# Patient Record
Sex: Female | Born: 1970
Health system: Southern US, Community
[De-identification: ages and names within clinical notes are randomized; demographics above are authoritative.]

## PROBLEM LIST (undated history)

## (undated) DIAGNOSIS — R05 Cough: Secondary | ICD-10-CM

## (undated) DIAGNOSIS — I499 Cardiac arrhythmia, unspecified: Secondary | ICD-10-CM

## (undated) DIAGNOSIS — T7840XA Allergy, unspecified, initial encounter: Secondary | ICD-10-CM

## (undated) DIAGNOSIS — C569 Malignant neoplasm of unspecified ovary: Secondary | ICD-10-CM

## (undated) DIAGNOSIS — E039 Hypothyroidism, unspecified: Secondary | ICD-10-CM

## (undated) DIAGNOSIS — A6920 Lyme disease, unspecified: Secondary | ICD-10-CM

## (undated) DIAGNOSIS — F419 Anxiety disorder, unspecified: Secondary | ICD-10-CM

## (undated) DIAGNOSIS — K219 Gastro-esophageal reflux disease without esophagitis: Secondary | ICD-10-CM

## (undated) DIAGNOSIS — R059 Cough, unspecified: Secondary | ICD-10-CM

## (undated) DIAGNOSIS — E785 Hyperlipidemia, unspecified: Secondary | ICD-10-CM

## (undated) DIAGNOSIS — S39012A Strain of muscle, fascia and tendon of lower back, initial encounter: Secondary | ICD-10-CM

## (undated) DIAGNOSIS — T753XXA Motion sickness, initial encounter: Secondary | ICD-10-CM

## (undated) DIAGNOSIS — J302 Other seasonal allergic rhinitis: Secondary | ICD-10-CM

## (undated) HISTORY — DX: Lyme disease, unspecified: A69.20

## (undated) HISTORY — DX: Malignant neoplasm of unspecified ovary: C56.9

## (undated) HISTORY — PX: LEFT OOPHORECTOMY: SHX1961

## (undated) HISTORY — DX: Allergy, unspecified, initial encounter: T78.40XA

## (undated) HISTORY — DX: Hyperlipidemia, unspecified: E78.5

---
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
## 2003-06-03 ENCOUNTER — Other Ambulatory Visit: Admission: RE | Admit: 2003-06-03 | Discharge: 2003-06-03 | Payer: Self-pay | Admitting: Obstetrics and Gynecology

## 2004-06-03 ENCOUNTER — Ambulatory Visit (HOSPITAL_COMMUNITY): Admission: RE | Admit: 2004-06-03 | Discharge: 2004-06-03 | Payer: Self-pay | Admitting: Obstetrics and Gynecology

## 2004-06-03 ENCOUNTER — Ambulatory Visit (HOSPITAL_BASED_OUTPATIENT_CLINIC_OR_DEPARTMENT_OTHER): Admission: RE | Admit: 2004-06-03 | Discharge: 2004-06-03 | Payer: Self-pay | Admitting: Obstetrics and Gynecology

## 2005-07-08 ENCOUNTER — Inpatient Hospital Stay (HOSPITAL_COMMUNITY): Admission: AD | Admit: 2005-07-08 | Discharge: 2005-07-08 | Payer: Self-pay | Admitting: Obstetrics & Gynecology

## 2005-08-06 ENCOUNTER — Inpatient Hospital Stay (HOSPITAL_COMMUNITY): Admission: AD | Admit: 2005-08-06 | Discharge: 2005-08-08 | Payer: Self-pay | Admitting: Obstetrics and Gynecology

## 2011-08-05 ENCOUNTER — Ambulatory Visit: Payer: Self-pay | Admitting: Unknown Physician Specialty

## 2011-09-09 ENCOUNTER — Ambulatory Visit: Payer: Self-pay | Admitting: Unknown Physician Specialty

## 2013-05-11 ENCOUNTER — Ambulatory Visit: Payer: Self-pay

## 2013-08-14 ENCOUNTER — Emergency Department: Payer: Self-pay | Admitting: Emergency Medicine

## 2013-08-14 LAB — COMPREHENSIVE METABOLIC PANEL
Albumin: 3.9 g/dL (ref 3.4–5.0)
Alkaline Phosphatase: 62 U/L
Anion Gap: 1 — ABNORMAL LOW (ref 7–16)
BUN: 10 mg/dL (ref 7–18)
Bilirubin,Total: 0.4 mg/dL (ref 0.2–1.0)
Calcium, Total: 8.9 mg/dL (ref 8.5–10.1)
Chloride: 105 mmol/L (ref 98–107)
Co2: 30 mmol/L (ref 21–32)
Creatinine: 0.7 mg/dL (ref 0.60–1.30)
EGFR (African American): >60
EGFR (Non-African Amer.): >60
Glucose: 95 mg/dL (ref 65–99)
Osmolality: 271 (ref 275–301)
Potassium: 4.3 mmol/L (ref 3.5–5.1)
SGOT(AST): 26 U/L (ref 15–37)
SGPT (ALT): 22 U/L (ref 12–78)
Sodium: 136 mmol/L (ref 136–145)
Total Protein: 7.4 g/dL (ref 6.4–8.2)

## 2013-08-14 LAB — CBC
HCT: 39.9 % (ref 35.0–47.0)
HGB: 13.7 g/dL (ref 12.0–16.0)
MCH: 32.1 pg (ref 26.0–34.0)
MCHC: 34.4 g/dL (ref 32.0–36.0)
MCV: 93 fL (ref 80–100)
Platelet: 269 10*3/uL (ref 150–440)
RBC: 4.28 10*6/uL (ref 3.80–5.20)
RDW: 12.3 % (ref 11.5–14.5)
WBC: 6.5 10*3/uL (ref 3.6–11.0)

## 2013-08-14 LAB — T4, FREE: Free Thyroxine: 1.37 ng/dL (ref 0.76–1.46)

## 2013-08-14 LAB — TROPONIN I: Troponin-I: <0.02 ng/mL

## 2015-01-27 ENCOUNTER — Encounter: Payer: Self-pay | Admitting: *Deleted

## 2015-01-29 NOTE — Discharge Instructions (Signed)

## 2015-01-31 ENCOUNTER — Encounter: Payer: Self-pay | Admitting: Unknown Physician Specialty

## 2015-01-31 ENCOUNTER — Ambulatory Visit: Payer: BLUE CROSS/BLUE SHIELD | Admitting: Student in an Organized Health Care Education/Training Program

## 2015-01-31 ENCOUNTER — Ambulatory Visit
Admission: RE | Admit: 2015-01-31 | Discharge: 2015-01-31 | Disposition: A | Discharge status: Home / Self Care | Payer: BLUE CROSS/BLUE SHIELD | Source: Ambulatory Visit | Attending: Unknown Physician Specialty | Admitting: Unknown Physician Specialty

## 2015-01-31 ENCOUNTER — Encounter
Admission: RE | Disposition: A | Discharge status: Home / Self Care | Payer: Self-pay | Source: Ambulatory Visit | Attending: Unknown Physician Specialty

## 2015-01-31 DIAGNOSIS — Z8543 Personal history of malignant neoplasm of ovary: Secondary | ICD-10-CM | POA: Diagnosis not present

## 2015-01-31 DIAGNOSIS — R229 Localized swelling, mass and lump, unspecified: Secondary | ICD-10-CM | POA: Diagnosis present

## 2015-01-31 DIAGNOSIS — Z79899 Other long term (current) drug therapy: Secondary | ICD-10-CM | POA: Insufficient documentation

## 2015-01-31 DIAGNOSIS — L720 Epidermal cyst: Secondary | ICD-10-CM | POA: Diagnosis not present

## 2015-01-31 DIAGNOSIS — F17200 Nicotine dependence, unspecified, uncomplicated: Secondary | ICD-10-CM | POA: Diagnosis not present

## 2015-01-31 DIAGNOSIS — E079 Disorder of thyroid, unspecified: Secondary | ICD-10-CM | POA: Insufficient documentation

## 2015-01-31 DIAGNOSIS — Z90722 Acquired absence of ovaries, bilateral: Secondary | ICD-10-CM | POA: Diagnosis not present

## 2015-01-31 HISTORY — DX: Cardiac arrhythmia, unspecified: I49.9

## 2015-01-31 HISTORY — PX: MASS BIOPSY: SHX5445

## 2015-01-31 HISTORY — DX: Cough, unspecified: R05.9

## 2015-01-31 HISTORY — DX: Anxiety disorder, unspecified: F41.9

## 2015-01-31 HISTORY — DX: Other seasonal allergic rhinitis: J30.2

## 2015-01-31 HISTORY — DX: Hypothyroidism, unspecified: E03.9

## 2015-01-31 HISTORY — DX: Motion sickness, initial encounter: T75.3XXA

## 2015-01-31 HISTORY — DX: Strain of muscle, fascia and tendon of lower back, initial encounter: S39.012A

## 2015-01-31 HISTORY — DX: Cough: R05

## 2015-01-31 HISTORY — DX: Gastro-esophageal reflux disease without esophagitis: K21.9

## 2015-01-31 SURGERY — BIOPSY, MASS, NECK
Anesthesia: General | Wound class: Clean

## 2015-01-31 MED ORDER — LACTATED RINGERS IV SOLN
INTRAVENOUS | Status: DC
  Administered 2015-01-31 (×2): via INTRAVENOUS

## 2015-01-31 MED ORDER — CEPHALEXIN 500 MG PO CAPS: 500.0000 mg | ORAL_CAPSULE | Freq: Two times a day (BID) | ORAL | Status: DC

## 2015-01-31 MED ORDER — PROPOFOL INFUSION 10 MG/ML OPTIME
INTRAVENOUS | Status: DC | PRN
  Administered 2015-01-31: 75 ug/kg/min via INTRAVENOUS

## 2015-01-31 MED ORDER — FENTANYL CITRATE (PF) 100 MCG/2ML IJ SOLN
INTRAMUSCULAR | Status: DC | PRN
  Administered 2015-01-31: 100 ug via INTRAVENOUS

## 2015-01-31 MED ORDER — LIDOCAINE-EPINEPHRINE 1 %-1:100000 IJ SOLN
INTRAMUSCULAR | Status: DC | PRN
  Administered 2015-01-31: 2 mL

## 2015-01-31 MED ORDER — LIDOCAINE HCL (CARDIAC) 20 MG/ML IV SOLN
INTRAVENOUS | Status: DC | PRN
  Administered 2015-01-31: 50 mg via INTRAVENOUS

## 2015-01-31 SURGICAL SUPPLY — 20 items
BLADE SURG 15 STRL LF DISP TIS (BLADE) ×1 IMPLANT
BLADE SURG 15 STRL SS (BLADE) ×2
CANISTER SUCT 1200ML W/VALVE (MISCELLANEOUS) ×2 IMPLANT
CORD BIP STRL DISP 12FT (MISCELLANEOUS) ×2 IMPLANT
ELECT CAUTERY BLADE TIP 2.5 (TIP) ×2
ELECTRODE CAUTERY BLDE TIP 2.5 (TIP) ×1 IMPLANT
GLOVE BIO SURGEON STRL SZ7.5 (GLOVE) ×4 IMPLANT
GOWN STRL REUS W/ TWL LRG LVL3 (GOWN DISPOSABLE) ×2 IMPLANT
GOWN STRL REUS W/TWL LRG LVL3 (GOWN DISPOSABLE) ×4
LIQUID BAND (GAUZE/BANDAGES/DRESSINGS) ×2 IMPLANT
NEEDLE HYPO 25GX1X1/2 BEV (NEEDLE) ×2 IMPLANT
NS IRRIG 500ML POUR BTL (IV SOLUTION) ×2 IMPLANT
PACK HEAD/NECK (MISCELLANEOUS) ×2 IMPLANT
PAD GROUND ADULT SPLIT (MISCELLANEOUS) ×2 IMPLANT
SPONGE KITTNER 5P (MISCELLANEOUS) ×2 IMPLANT
SUCTION FRAZIER TIP 10 FR DISP (SUCTIONS) ×2 IMPLANT
SUT SILK 2 0 (SUTURE)
SUT SILK 2-0 18XBRD TIE 12 (SUTURE) IMPLANT
SUT VIC AB 4-0 RB1 18 (SUTURE) ×2 IMPLANT
TOWEL OR 17X26 4PK STRL BLUE (TOWEL DISPOSABLE) ×2 IMPLANT

## 2015-01-31 NOTE — Op Note (Signed)
01/31/2015  12:13 PM    Jeanella Anton  [id number]   Pre-Op Dx: EPIDERMAL CYST  Post-op Dx: SAME  Proc: Excision Epidermal Inclusion cyst submental area of face 1x1cm   Surg:  Karista Aispuro T  Anes:  GOT  EBL:  <2cc  Comp:  none  Findings:  1x1 cm cystic mass  Procedure: Adahlia was taken to the OR and place in supine position.  IV sedation was administered.  The chin was prepped and draped sterilely.  A local anesthetic of lidocaine 1% with 1/100000 epi was used.  2cc injected.  A incision was made in a natural skin crease below the mass.  Dissection using short sharp scissors was performed identifying the mass.  It was excised in total from the surrounding tissues.  Hemostasis achieved using microbipolar.  The wound was irrigated using saline.  The incision was closed using 4-0 vicryl in a subcutaneous fashion.  The skin was sealed using dermabond.  The patient was taken to PACU in stable condition.  Dispo:   Home  Plan:  Discharge to home.    Emmert Roethler T  01/31/2015 12:13 PM

## 2015-01-31 NOTE — H&P (Signed)
  H+P  Reviewed and will be scanned in later. No changes noted. 

## 2015-01-31 NOTE — Transfer of Care (Signed)
Immediate Anesthesia Transfer of Care Note  Patient: Emily Knox  Procedure(s) Performed: Procedure(s): NECK MASS BIOPSY (N/A)  Patient Location: PACU  Anesthesia Type: General  Level of Consciousness: awake, alert  and patient cooperative  Airway and Oxygen Therapy: Patient Spontanous Breathing and Patient connected to supplemental oxygen  Post-op Assessment: Post-op Vital signs reviewed, Patient's Cardiovascular Status Stable, Respiratory Function Stable, Patent Airway and No signs of Nausea or vomiting  Post-op Vital Signs: Reviewed and stable  Complications: No apparent anesthesia complications

## 2015-01-31 NOTE — Anesthesia Preprocedure Evaluation (Signed)
Anesthesia Evaluation  Patient identified by MRN, date of birth, ID band Patient awake    Reviewed: Allergy & Precautions, NPO status , Patient's Chart, lab work & pertinent test results  Airway Mallampati: II  TM Distance: >3 FB Neck ROM: Full    Dental no notable dental hx.    Pulmonary neg pulmonary ROS, Current Smoker,  breath sounds clear to auscultation  Pulmonary exam normal       Cardiovascular negative cardio ROS Normal cardiovascular examRhythm:Regular Rate:Normal     Neuro/Psych negative neurological ROS  negative psych ROS   GI/Hepatic negative GI ROS, Neg liver ROS, GERD-  Medicated and Controlled,  Endo/Other  negative endocrine ROSHypothyroidism   Renal/GU negative Renal ROS  negative genitourinary   Musculoskeletal negative musculoskeletal ROS (+)   Abdominal   Peds negative pediatric ROS (+)  Hematology negative hematology ROS (+)   Anesthesia Other Findings   Reproductive/Obstetrics negative OB ROS                             Anesthesia Physical Anesthesia Plan  ASA: II  Anesthesia Plan: General   Post-op Pain Management:    Induction: Intravenous  Airway Management Planned:   Additional Equipment:   Intra-op Plan:   Post-operative Plan: Extubation in OR  Informed Consent: I have reviewed the patients History and Physical, chart, labs and discussed the procedure including the risks, benefits and alternatives for the proposed anesthesia with the patient or authorized representative who has indicated his/her understanding and acceptance.   Dental advisory given  Plan Discussed with: CRNA  Anesthesia Plan Comments:         Anesthesia Quick Evaluation

## 2015-01-31 NOTE — Anesthesia Postprocedure Evaluation (Signed)
  Anesthesia Post-op Note  Patient: Emily Knox  Procedure(s) Performed: Procedure(s): NECK MASS BIOPSY (N/A)  Anesthesia type:General  Patient location: PACU  Post pain: Pain level controlled  Post assessment: Post-op Vital signs reviewed, Patient's Cardiovascular Status Stable, Respiratory Function Stable, Patent Airway and No signs of Nausea or vomiting  Post vital signs: Reviewed and stable  Last Vitals:  Filed Vitals:   01/31/15 1223  BP:   Pulse: 94  Temp:   Resp: 22    Level of consciousness: awake, alert  and patient cooperative  Complications: No apparent anesthesia complications

## 2015-01-31 NOTE — Anesthesia Procedure Notes (Signed)
Procedure Name: MAC Performed by: Giorgia Wahler Pre-anesthesia Checklist: Patient identified, Emergency Drugs available, Suction available, Timeout performed and Patient being monitored Patient Re-evaluated:Patient Re-evaluated prior to inductionOxygen Delivery Method: Nasal cannula Placement Confirmation: positive ETCO2       

## 2015-02-04 LAB — SURGICAL PATHOLOGY

## 2016-05-20 DIAGNOSIS — R112 Nausea with vomiting, unspecified: Secondary | ICD-10-CM | POA: Diagnosis not present

## 2016-08-10 DIAGNOSIS — E039 Hypothyroidism, unspecified: Secondary | ICD-10-CM | POA: Diagnosis not present

## 2016-08-17 DIAGNOSIS — E039 Hypothyroidism, unspecified: Secondary | ICD-10-CM | POA: Diagnosis not present

## 2016-08-17 DIAGNOSIS — F419 Anxiety disorder, unspecified: Secondary | ICD-10-CM | POA: Diagnosis not present

## 2016-08-17 DIAGNOSIS — G5603 Carpal tunnel syndrome, bilateral upper limbs: Secondary | ICD-10-CM | POA: Diagnosis not present

## 2016-08-21 DIAGNOSIS — J019 Acute sinusitis, unspecified: Secondary | ICD-10-CM | POA: Diagnosis not present

## 2016-08-28 DIAGNOSIS — J309 Allergic rhinitis, unspecified: Secondary | ICD-10-CM | POA: Diagnosis not present

## 2016-08-28 DIAGNOSIS — R05 Cough: Secondary | ICD-10-CM | POA: Diagnosis not present

## 2016-08-28 DIAGNOSIS — J019 Acute sinusitis, unspecified: Secondary | ICD-10-CM | POA: Diagnosis not present

## 2016-09-22 DIAGNOSIS — J019 Acute sinusitis, unspecified: Secondary | ICD-10-CM | POA: Diagnosis not present

## 2017-02-09 DIAGNOSIS — J019 Acute sinusitis, unspecified: Secondary | ICD-10-CM | POA: Diagnosis not present

## 2017-02-09 DIAGNOSIS — R51 Headache: Secondary | ICD-10-CM | POA: Diagnosis not present

## 2017-02-09 DIAGNOSIS — B9689 Other specified bacterial agents as the cause of diseases classified elsewhere: Secondary | ICD-10-CM | POA: Diagnosis not present

## 2017-02-09 DIAGNOSIS — R5383 Other fatigue: Secondary | ICD-10-CM | POA: Diagnosis not present

## 2017-04-14 ENCOUNTER — Ambulatory Visit (INDEPENDENT_AMBULATORY_CARE_PROVIDER_SITE_OTHER): Payer: BLUE CROSS/BLUE SHIELD | Admitting: Family

## 2017-04-14 ENCOUNTER — Encounter: Payer: Self-pay | Admitting: Family

## 2017-04-14 VITALS — BP 110/70 | HR 87 | Temp 98.4°F | Resp 12 | Ht 61.0 in | Wt 118.0 lb

## 2017-04-14 DIAGNOSIS — Z8543 Personal history of malignant neoplasm of ovary: Secondary | ICD-10-CM | POA: Insufficient documentation

## 2017-04-14 DIAGNOSIS — E039 Hypothyroidism, unspecified: Secondary | ICD-10-CM | POA: Insufficient documentation

## 2017-04-14 DIAGNOSIS — Z0000 Encounter for general adult medical examination without abnormal findings: Secondary | ICD-10-CM | POA: Insufficient documentation

## 2017-04-14 DIAGNOSIS — Z7689 Persons encountering health services in other specified circumstances: Secondary | ICD-10-CM

## 2017-04-14 DIAGNOSIS — F411 Generalized anxiety disorder: Secondary | ICD-10-CM

## 2017-04-14 NOTE — Assessment & Plan Note (Signed)
Asymptomatic .Follows endocrine, Dr. Michiel Sites

## 2017-04-14 NOTE — Assessment & Plan Note (Signed)
Clinically asymptomatic today. Discussed importance of maintaining CPE and also considering CT abdomen, CA 125 at CPE. Patient and I will continue to discuss what is comfortable surveillance for her.

## 2017-04-14 NOTE — Assessment & Plan Note (Signed)
Reviewed prior medical history. Patient will return for CPE, fasting labs. She is also due for Pap smear and mammogram.

## 2017-04-14 NOTE — Progress Notes (Signed)
Subjective:    Patient ID: Emily Knox, female    DOB: [birth year], 46 y.o.   MRN: [id number]  CC: Emily Knox is a 46 y.o. female who presents today to establish care.    HPI:  No prior pcp.   Hypothyroidism- follows with dr Chalmers Cater and checks TSH routinely. No cold/heat intolerance, skin changes.  GAD- Started wellbutrin couple of years ago to quit smoking and how helps with   No depression. No thoughts of nurting or anyone else. Notes increased anxiety- mostly because work as 'boomed'. Has had h/o panic attacks, none recently.  Wellbutrin helps.   Smoker  H/o ovarian cancer- states dysgerminoma.  left oophorectomy when 46 years old  Followed with Dr Laverta [facility], no longer follows with OB . In her 20's , would run the ca-125. No pelvic, dyspareunia, abdominal distention.        HISTORY:  Past Medical History:  Diagnosis Date  . Allergy   . Anxiety    panic attacks  . Cough    due to allergies  . Dysrhythmia    palpatations - due to thyroid  . GERD (gastroesophageal reflux disease)   . Hyperlipidemia   . Hypothyroidism   . Low back strain   . Lyme disease    2010  . Motion sickness    all moving vehicles  . Ovarian cancer ([place]) 1991   left ovary  . Seasonal allergies    Past Surgical History:  Procedure Laterality Date  . LEFT OOPHORECTOMY    . MASS BIOPSY N/A 01/31/2015   Procedure: NECK MASS BIOPSY;  Surgeon: Beverly Gust, MD;  Location: [facility];  Service: ENT;  Laterality: N/A;   History reviewed. No pertinent family history.  Allergies: Onion; Peanuts [peanut oil]; and Tomato Current Outpatient Prescriptions on File Prior to Visit  Medication Sig Dispense Refill  . cephALEXin (KEFLEX) 500 MG capsule Take 1 capsule (500 mg total) by mouth 2 (two) times daily. (Patient not taking: Reported on 04/14/2017) 10 capsule 0  . levothyroxine (SYNTHROID, LEVOTHROID) 112 MCG tablet Take 112 mcg by mouth daily before breakfast.    .  Nutritional Supplements (JUICE PLUS FIBRE PO) Take by mouth daily.     No current facility-administered medications on file prior to visit.     Social History  Substance Use Topics  . Smoking status: Current Every Day Smoker    Packs/day: 1.00    Years: 20.00  . Smokeless tobacco: Never Used  . Alcohol use 2.4 oz/week    4 Cans of beer per week    Review of Systems  Constitutional: Negative for chills and fever.  Respiratory: Negative for cough.   Cardiovascular: Negative for chest pain and palpitations.  Gastrointestinal: Negative for abdominal distention, abdominal pain, nausea and vomiting.  Genitourinary: Negative for dyspareunia.      Objective:    BP 110/70 (BP Location: Left Arm, Patient Position: Sitting, Cuff Size: Normal)   Pulse 87   Temp 98.4 F (36.9 C) (Oral)   Resp 12   Ht 5\' 1"  (1.549 m)   Wt 118 lb (53.5 kg)   SpO2 99%   BMI 22.30 kg/m  BP Readings from Last 3 Encounters:  04/14/17 110/70  01/31/15 110/71   Wt Readings from Last 3 Encounters:  04/14/17 118 lb (53.5 kg)  01/31/15 118 lb (53.5 kg)    Physical Exam  Constitutional: She appears well-developed and well-nourished.  Eyes: Conjunctivae are normal.  Cardiovascular: Normal rate, regular rhythm,  normal heart sounds and normal pulses.   Pulmonary/Chest: Effort normal and breath sounds normal. She has no wheezes. She has no rhonchi. She has no rales.  Neurological: She is alert.  Skin: Skin is warm and dry.  Psychiatric: She has a normal mood and affect. Her speech is normal and behavior is normal. Thought content normal.  Vitals reviewed.      Assessment & Plan:   Problem List Items Addressed This Visit      Endocrine   Hypothyroidism    Asymptomatic .Follows endocrine, Dr. Michiel Sites      Relevant Medications   SYNTHROID 100 MCG tablet     Other   Encounter to establish care - Primary    Reviewed prior medical history. Patient will return for CPE, fasting labs. She is also due  for Pap smear and mammogram.      GAD (generalized anxiety disorder)    Stable. Controlled on Wellbutrin. Will continue current regimen at this time.      History of ovarian cancer    Clinically asymptomatic today. Discussed importance of maintaining CPE and also considering CT abdomen, CA 125 at CPE. Patient and I will continue to discuss what is comfortable surveillance for her.           I am having Ms. Mothershead maintain her levothyroxine, Nutritional Supplements (JUICE PLUS FIBRE PO), cephALEXin, buPROPion, and SYNTHROID.   Meds ordered this encounter  Medications  . buPROPion (WELLBUTRIN XL) 150 MG 24 hr tablet    Sig: take 1 tablet by mouth once daily  . SYNTHROID 100 MCG tablet    Refill:  3    Return precautions given.   Risks, benefits, and alternatives of the medications and treatment plan prescribed today were discussed, and patient expressed understanding.   Education regarding symptom management and diagnosis given to patient on AVS.  Continue to follow with Burnard Hawthorne, FNP for routine health maintenance.   Emily Knox and I agreed with plan.   Mable Paris, FNP

## 2017-04-14 NOTE — Assessment & Plan Note (Signed)
Stable. Controlled on Wellbutrin. Will continue current regimen at this time.

## 2017-04-14 NOTE — Patient Instructions (Signed)
Pleasure meeting you Please return fasting labs and physical exam including Pap smear.

## 2017-04-21 ENCOUNTER — Encounter: Payer: Self-pay | Admitting: Family

## 2017-04-21 ENCOUNTER — Other Ambulatory Visit (HOSPITAL_COMMUNITY)
Admission: RE | Admit: 2017-04-21 | Discharge: 2017-04-21 | Disposition: A | Discharge status: Home / Self Care | Payer: BLUE CROSS/BLUE SHIELD | Source: Ambulatory Visit | Attending: Family | Admitting: Family

## 2017-04-21 ENCOUNTER — Ambulatory Visit (INDEPENDENT_AMBULATORY_CARE_PROVIDER_SITE_OTHER): Payer: BLUE CROSS/BLUE SHIELD | Admitting: Family

## 2017-04-21 VITALS — HR 84 | Temp 98.2°F | Ht 61.0 in | Wt 118.2 lb

## 2017-04-21 DIAGNOSIS — Z0000 Encounter for general adult medical examination without abnormal findings: Secondary | ICD-10-CM | POA: Diagnosis not present

## 2017-04-21 DIAGNOSIS — Z8543 Personal history of malignant neoplasm of ovary: Secondary | ICD-10-CM

## 2017-04-21 LAB — CBC WITH DIFFERENTIAL/PLATELET
Basophils Absolute: 0.1 10*3/uL (ref 0.0–0.1)
Basophils Relative: 2.1 % (ref 0.0–3.0)
Eosinophils Absolute: 0.4 10*3/uL (ref 0.0–0.7)
Eosinophils Relative: 5.1 % — ABNORMAL HIGH (ref 0.0–5.0)
HCT: 43.5 % (ref 36.0–46.0)
Hemoglobin: 14.6 g/dL (ref 12.0–15.0)
Lymphocytes Relative: 32.5 % (ref 12.0–46.0)
Lymphs Abs: 2.2 10*3/uL (ref 0.7–4.0)
MCHC: 33.4 g/dL (ref 30.0–36.0)
MCV: 96.3 fl (ref 78.0–100.0)
Monocytes Absolute: 0.6 10*3/uL (ref 0.1–1.0)
Monocytes Relative: 8.1 % (ref 3.0–12.0)
Neutro Abs: 3.6 10*3/uL (ref 1.4–7.7)
Neutrophils Relative %: 52.2 % (ref 43.0–77.0)
Platelets: 317 10*3/uL (ref 150.0–400.0)
RBC: 4.52 Mil/uL (ref 3.87–5.11)
RDW: 12.7 % (ref 11.5–15.5)
WBC: 6.9 10*3/uL (ref 4.0–10.5)

## 2017-04-21 LAB — LIPID PANEL
Cholesterol: 179 mg/dL (ref 0–200)
HDL: 58.8 mg/dL (ref >39.00)
LDL Cholesterol: 102 mg/dL — ABNORMAL HIGH (ref 0–99)
NonHDL: 120.38
Total CHOL/HDL Ratio: 3
Triglycerides: 90 mg/dL (ref 0.0–149.0)
VLDL: 18 mg/dL (ref 0.0–40.0)

## 2017-04-21 LAB — COMPREHENSIVE METABOLIC PANEL
ALT: 14 U/L (ref 0–35)
AST: 22 U/L (ref 0–37)
Albumin: 4.7 g/dL (ref 3.5–5.2)
Alkaline Phosphatase: 47 U/L (ref 39–117)
BUN: 10 mg/dL (ref 6–23)
CO2: 29 mEq/L (ref 19–32)
Calcium: 9.7 mg/dL (ref 8.4–10.5)
Chloride: 103 mEq/L (ref 96–112)
Creatinine, Ser: 0.72 mg/dL (ref 0.40–1.20)
GFR: 92.75 mL/min (ref >60.00)
Glucose, Bld: 98 mg/dL (ref 70–99)
Potassium: 4.1 mEq/L (ref 3.5–5.1)
Sodium: 140 mEq/L (ref 135–145)
Total Bilirubin: 0.4 mg/dL (ref 0.2–1.2)
Total Protein: 7.8 g/dL (ref 6.0–8.3)

## 2017-04-21 LAB — VITAMIN D 25 HYDROXY (VIT D DEFICIENCY, FRACTURES): VITD: 42.79 ng/mL (ref 30.00–100.00)

## 2017-04-21 LAB — HEMOGLOBIN A1C: Hgb A1c MFr Bld: 5.8 % (ref 4.6–6.5)

## 2017-04-21 LAB — TSH: TSH: 2.59 u[IU]/mL (ref 0.35–4.50)

## 2017-04-21 NOTE — Patient Instructions (Addendum)
Let me know about colon cancer history and family history in general - may drop off a sheet or message on mychart.  Tdap vaccine at local pharmacy  Health Maintenance, Female Adopting a healthy lifestyle and getting preventive care can go a long way to promote health and wellness. Talk with your health care provider about what schedule of regular examinations is right for you. This is a good chance for you to check in with your provider about disease prevention and staying healthy. In between checkups, there are plenty of things you can do on your own. Experts have done a lot of research about which lifestyle changes and preventive measures are most likely to keep you healthy. Ask your health care provider for more information. Weight and diet Eat a healthy diet  Be sure to include plenty of vegetables, fruits, low-fat dairy products, and lean protein.  Do not eat a lot of foods high in solid fats, added sugars, or salt.  Get regular exercise. This is one of the most important things you can do for your health. ? Most adults should exercise for at least 150 minutes each week. The exercise should increase your heart rate and make you sweat (moderate-intensity exercise). ? Most adults should also do strengthening exercises at least twice a week. This is in addition to the moderate-intensity exercise.  Maintain a healthy weight  Body mass index (BMI) is a measurement that can be used to identify possible weight problems. It estimates body fat based on height and weight. Your health care provider can help determine your BMI and help you achieve or maintain a healthy weight.  For females 63 years of age and older: ? A BMI below 18.5 is considered underweight. ? A BMI of 18.5 to 24.9 is normal. ? A BMI of 25 to 29.9 is considered overweight. ? A BMI of 30 and above is considered obese.  Watch levels of cholesterol and blood lipids  You should start having your blood tested for lipids and  cholesterol at 46 years of age, then have this test every 5 years.  You may need to have your cholesterol levels checked more often if: ? Your lipid or cholesterol levels are high. ? You are older than 46 years of age. ? You are at high risk for heart disease.  Cancer screening Lung Cancer  Lung cancer screening is recommended for adults 65-77 years old who are at high risk for lung cancer because of a history of smoking.  A yearly low-dose CT scan of the lungs is recommended for people who: ? Currently smoke. ? Have quit within the past 15 years. ? Have at least a 30-pack-year history of smoking. A pack year is smoking an average of one pack of cigarettes a day for 1 year.  Yearly screening should continue until it has been 15 years since you quit.  Yearly screening should stop if you develop a health problem that would prevent you from having lung cancer treatment.  Breast Cancer  Practice breast self-awareness. This means understanding how your breasts normally appear and feel.  It also means doing regular breast self-exams. Let your health care provider know about any changes, no matter how small.  If you are in your 20s or 30s, you should have a clinical breast exam (CBE) by a health care provider every 1-3 years as part of a regular health exam.  If you are 58 or older, have a CBE every year. Also consider having a breast X-ray (  mammogram) every year.  If you have a family history of breast cancer, talk to your health care provider about genetic screening.  If you are at high risk for breast cancer, talk to your health care provider about having an MRI and a mammogram every year.  Breast cancer gene (BRCA) assessment is recommended for women who have family members with BRCA-related cancers. BRCA-related cancers include: ? Breast. ? Ovarian. ? Tubal. ? Peritoneal cancers.  Results of the assessment will determine the need for genetic counseling and BRCA1 and BRCA2  testing.  Cervical Cancer Your health care provider may recommend that you be screened regularly for cancer of the pelvic organs (ovaries, uterus, and vagina). This screening involves a pelvic examination, including checking for microscopic changes to the surface of your cervix (Pap test). You may be encouraged to have this screening done every 3 years, beginning at age 63.  For women ages 32-65, health care providers may recommend pelvic exams and Pap testing every 3 years, or they may recommend the Pap and pelvic exam, combined with testing for human papilloma virus (HPV), every 5 years. Some types of HPV increase your risk of cervical cancer. Testing for HPV may also be done on women of any age with unclear Pap test results.  Other health care providers may not recommend any screening for nonpregnant women who are considered low risk for pelvic cancer and who do not have symptoms. Ask your health care provider if a screening pelvic exam is right for you.  If you have had past treatment for cervical cancer or a condition that could lead to cancer, you need Pap tests and screening for cancer for at least 20 years after your treatment. If Pap tests have been discontinued, your risk factors (such as having a new sexual partner) need to be reassessed to determine if screening should resume. Some women have medical problems that increase the chance of getting cervical cancer. In these cases, your health care provider may recommend more frequent screening and Pap tests.  Colorectal Cancer  This type of cancer can be detected and often prevented.  Routine colorectal cancer screening usually begins at 46 years of age and continues through 46 years of age.  Your health care provider may recommend screening at an earlier age if you have risk factors for colon cancer.  Your health care provider may also recommend using home test kits to check for hidden blood in the stool.  A small camera at the end of a  tube can be used to examine your colon directly (sigmoidoscopy or colonoscopy). This is done to check for the earliest forms of colorectal cancer.  Routine screening usually begins at age 80.  Direct examination of the colon should be repeated every 5-10 years through 46 years of age. However, you may need to be screened more often if early forms of precancerous polyps or small growths are found.  Skin Cancer  Check your skin from head to toe regularly.  Tell your health care provider about any new moles or changes in moles, especially if there is a change in a mole's shape or color.  Also tell your health care provider if you have a mole that is larger than the size of a pencil eraser.  Always use sunscreen. Apply sunscreen liberally and repeatedly throughout the day.  Protect yourself by wearing long sleeves, pants, a wide-brimmed hat, and sunglasses whenever you are outside.  Heart disease, diabetes, and high blood pressure  High blood pressure  causes heart disease and increases the risk of stroke. High blood pressure is more likely to develop in: ? People who have blood pressure in the high end of the normal range (130-139/85-89 mm Hg). ? People who are overweight or obese. ? People who are African American.  If you are 84-42 years of age, have your blood pressure checked every 3-5 years. If you are 50 years of age or older, have your blood pressure checked every year. You should have your blood pressure measured twice-once when you are at a hospital or clinic, and once when you are not at a hospital or clinic. Record the average of the two measurements. To check your blood pressure when you are not at a hospital or clinic, you can use: ? An automated blood pressure machine at a pharmacy. ? A home blood pressure monitor.  If you are between 7 years and 63 years old, ask your health care provider if you should take aspirin to prevent strokes.  Have regular diabetes screenings. This  involves taking a blood sample to check your fasting blood sugar level. ? If you are at a normal weight and have a low risk for diabetes, have this test once every three years after 46 years of age. ? If you are overweight and have a high risk for diabetes, consider being tested at a younger age or more often. Preventing infection Hepatitis B  If you have a higher risk for hepatitis B, you should be screened for this virus. You are considered at high risk for hepatitis B if: ? You were born in a country where hepatitis B is common. Ask your health care provider which countries are considered high risk. ? Your parents were born in a high-risk country, and you have not been immunized against hepatitis B (hepatitis B vaccine). ? You have HIV or AIDS. ? You use needles to inject street drugs. ? You live with someone who has hepatitis B. ? You have had sex with someone who has hepatitis B. ? You get hemodialysis treatment. ? You take certain medicines for conditions, including cancer, organ transplantation, and autoimmune conditions.  Hepatitis C  Blood testing is recommended for: ? Everyone born from [birth year] through 1965. ? Anyone with known risk factors for hepatitis C.  Sexually transmitted infections (STIs)  You should be screened for sexually transmitted infections (STIs) including gonorrhea and chlamydia if: ? You are sexually active and are younger than 46 years of age. ? You are older than 46 years of age and your health care provider tells you that you are at risk for this type of infection. ? Your sexual activity has changed since you were last screened and you are at an increased risk for chlamydia or gonorrhea. Ask your health care provider if you are at risk.  If you do not have HIV, but are at risk, it may be recommended that you take a prescription medicine daily to prevent HIV infection. This is called pre-exposure prophylaxis (PrEP). You are considered at risk if: ? You are  sexually active and do not regularly use condoms or know the HIV status of your partner(s). ? You take drugs by injection. ? You are sexually active with a partner who has HIV.  Talk with your health care provider about whether you are at high risk of being infected with HIV. If you choose to begin PrEP, you should first be tested for HIV. You should then be tested every 3 months for as long  as you are taking PrEP. Pregnancy  If you are premenopausal and you may become pregnant, ask your health care provider about preconception counseling.  If you may become pregnant, take 400 to 800 micrograms (mcg) of folic acid every day.  If you want to prevent pregnancy, talk to your health care provider about birth control (contraception). Osteoporosis and menopause  Osteoporosis is a disease in which the bones lose minerals and strength with aging. This can result in serious bone fractures. Your risk for osteoporosis can be identified using a bone density scan.  If you are 32 years of age or older, or if you are at risk for osteoporosis and fractures, ask your health care provider if you should be screened.  Ask your health care provider whether you should take a calcium or vitamin D supplement to lower your risk for osteoporosis.  Menopause may have certain physical symptoms and risks.  Hormone replacement therapy may reduce some of these symptoms and risks. Talk to your health care provider about whether hormone replacement therapy is right for you. Follow these instructions at home:  Schedule regular health, dental, and eye exams.  Stay current with your immunizations.  Do not use any tobacco products including cigarettes, chewing tobacco, or electronic cigarettes.  If you are pregnant, do not drink alcohol.  If you are breastfeeding, limit how much and how often you drink alcohol.  Limit alcohol intake to no more than 1 drink per day for nonpregnant women. One drink equals 12 ounces of  beer, 5 ounces of wine, or 1 ounces of hard liquor.  Do not use street drugs.  Do not share needles.  Ask your health care provider for help if you need support or information about quitting drugs.  Tell your health care provider if you often feel depressed.  Tell your health care provider if you have ever been abused or do not feel safe at home. This information is not intended to replace advice given to you by your health care provider. Make sure you discuss any questions you have with your health care provider. Document Released: 03/22/2011 Document Revised: 02/12/2016 Document Reviewed: 06/10/2015 Elsevier Interactive Patient Education  [employer].

## 2017-04-21 NOTE — Assessment & Plan Note (Signed)
Re-discussed surveillance with CT abdomen and pelvis and CA 125. Politely declines surveillance at this time, will let me know.

## 2017-04-21 NOTE — Progress Notes (Signed)
Pre visit review using our clinic review tool, if applicable. No additional management support is needed unless otherwise documented below in the visit note. 

## 2017-04-21 NOTE — Assessment & Plan Note (Addendum)
Pap and CBE performed. Advised 3d mammogram and tdap at local pharmacy. Labs today. Encouraged smoking cessation. Declines trying another medication such as chantix. No family history data in chart- patient will bring in or message me once she is home.

## 2017-04-21 NOTE — Progress Notes (Signed)
Subjective:    Patient ID: Emily Knox, female    DOB: 06/22/1971, 46 y.o.   MRN: [id number]  CC: Emily Knox is a 46 y.o. female who presents today for physical exam.    HPI: Feeling well  No complaints     History of ovarian cancer-declines ca 125, imaging at this time. No dyspareunia, abdominal distention.  Colorectal Cancer Screening: Unsure of family history. No abdominal pain or changes in BM. No blood in stool.  Breast Cancer Screening: Mammogram due Cervical Cancer Screening: due Bone Health screening/DEXA for 65+: No increased fracture risk. Defer screening at this time. Lung Cancer Screening: Doesn't have 30 year pack year history and age > 48 years.  Immunizations       Tetanus - due Declines pneumovax vaccine HIV Screening- Candidate for , consents Labs: Screening labs today. Exercise: Gets regular exercise.  Alcohol use: once per week Smoking/tobacco use: smoker.  Regular dental exams: utd Wears seat belt: Yes. Skin: no new lesions; no h/o skin cancer  HISTORY:  Past Medical History:  Diagnosis Date  . Allergy   . Anxiety    panic attacks  . Cough    due to allergies  . Dysrhythmia    palpatations - due to thyroid  . GERD (gastroesophageal reflux disease)   . Hyperlipidemia   . Hypothyroidism   . Low back strain   . Lyme disease    2010  . Motion sickness    all moving vehicles  . Ovarian cancer ([place]) 1991   left ovary  . Seasonal allergies     Past Surgical History:  Procedure Laterality Date  . LEFT OOPHORECTOMY    . MASS BIOPSY N/A 01/31/2015   Procedure: NECK MASS BIOPSY;  Surgeon: Beverly Gust, MD;  Location: [facility];  Service: ENT;  Laterality: N/A;   No family history on file.    ALLERGIES: Onion; Peanuts [peanut oil]; and Tomato  Current Outpatient Prescriptions on File Prior to Visit  Medication Sig Dispense Refill  . buPROPion (WELLBUTRIN XL) 150 MG 24 hr tablet take 1 tablet by mouth once daily     . cephALEXin (KEFLEX) 500 MG capsule Take 1 capsule (500 mg total) by mouth 2 (two) times daily. 10 capsule 0  . levothyroxine (SYNTHROID, LEVOTHROID) 112 MCG tablet Take 112 mcg by mouth daily before breakfast.    . Nutritional Supplements (JUICE PLUS FIBRE PO) Take by mouth daily.    Marland Kitchen SYNTHROID 100 MCG tablet   3   No current facility-administered medications on file prior to visit.     Social History  Substance Use Topics  . Smoking status: Current Every Day Smoker    Packs/day: 1.00    Years: 20.00  . Smokeless tobacco: Never Used  . Alcohol use 2.4 oz/week    4 Cans of beer per week    Review of Systems  Constitutional: Negative for chills, fever and unexpected weight change.  HENT: Negative for congestion.   Respiratory: Negative for cough.   Cardiovascular: Negative for chest pain, palpitations and leg swelling.  Gastrointestinal: Negative for abdominal distention, nausea and vomiting.  Genitourinary: Negative for dyspareunia and pelvic pain.  Musculoskeletal: Negative for arthralgias and myalgias.  Skin: Negative for rash.  Neurological: Negative for headaches.  Hematological: Negative for adenopathy.  Psychiatric/Behavioral: Negative for confusion.      Objective:    Pulse 84   Temp 98.2 F (36.8 C) (Oral)   Ht 5\' 1"  (1.549 m)  Wt 118 lb 3.2 oz (53.6 kg)   SpO2 98%   BMI 22.33 kg/m   BP Readings from Last 3 Encounters:  04/14/17 110/70  01/31/15 110/71   Wt Readings from Last 3 Encounters:  04/21/17 118 lb 3.2 oz (53.6 kg)  04/14/17 118 lb (53.5 kg)  01/31/15 118 lb (53.5 kg)    Physical Exam  Constitutional: She appears well-developed and well-nourished.  Eyes: Conjunctivae are normal.  Neck: No thyroid mass and no thyromegaly present.  Cardiovascular: Normal rate, regular rhythm, normal heart sounds and normal pulses.   Pulmonary/Chest: Effort normal and breath sounds normal. She has no wheezes. She has no rhonchi. She has no rales. Right  breast exhibits no inverted nipple, no mass, no nipple discharge, no skin change and no tenderness. Left breast exhibits no inverted nipple, no mass, no nipple discharge, no skin change and no tenderness. Breasts are symmetrical.  No masses or asymmetry appreciated during CBE.  Genitourinary: Uterus is not enlarged, not fixed and not tender. Cervix exhibits no motion tenderness, no discharge and no friability. Right adnexum displays no mass, no tenderness and no fullness. Left adnexum displays no mass, no tenderness and no fullness.  Genitourinary Comments: Pap performed. No CMT. Unable to appreciated ovaries.  Lymphadenopathy:       Head (right side): No submental, no submandibular, no tonsillar, no preauricular, no posterior auricular and no occipital adenopathy present.       Head (left side): No submental, no submandibular, no tonsillar, no preauricular, no posterior auricular and no occipital adenopathy present.       Right cervical: No superficial cervical, no deep cervical and no posterior cervical adenopathy present.      Left cervical: No superficial cervical, no deep cervical and no posterior cervical adenopathy present.    She has no axillary adenopathy.       Right axillary: No pectoral and no lateral adenopathy present.       Left axillary: No pectoral and no lateral adenopathy present. Neurological: She is alert.  Skin: Skin is warm and dry.  Psychiatric: She has a normal mood and affect. Her speech is normal and behavior is normal. Thought content normal.  Vitals reviewed.      Assessment & Plan:   Problem List Items Addressed This Visit      Other   Routine physical examination - Primary    Pap and CBE performed. Advised 3d mammogram and tdap at local pharmacy. Labs today. Encouraged smoking cessation. Declines trying another medication such as chantix. No family history data in chart- patient will bring in or message me once she is home.       Relevant Orders   CBC with  Differential/Platelet   Comprehensive metabolic panel   Hemoglobin A1c   HIV antibody   Lipid panel   Cytology - PAP   TSH   VITAMIN D 25 Hydroxy (Vit-D Deficiency, Fractures)   Ambulatory referral to Dermatology   History of ovarian cancer    Re-discussed surveillance with CT abdomen and pelvis and CA 125. Politely declines surveillance at this time, will let me know.           I am having Ms. Dhawan maintain her levothyroxine, Nutritional Supplements (JUICE PLUS FIBRE PO), cephALEXin, buPROPion, and SYNTHROID.   No orders of the defined types were placed in this encounter.   Return precautions given.   Risks, benefits, and alternatives of the medications and treatment plan prescribed today were discussed, and patient expressed understanding.  Education regarding symptom management and diagnosis given to patient on AVS.   Continue to follow with Burnard Hawthorne, FNP for routine health maintenance.   Emily Knox and I agreed with plan.   Mable Paris, FNP

## 2017-04-22 LAB — HIV ANTIBODY (ROUTINE TESTING W REFLEX): HIV 1&2 Ab, 4th Generation: NONREACTIVE

## 2017-04-22 LAB — CYTOLOGY - PAP
Diagnosis: NEGATIVE
HPV: NOT DETECTED

## 2017-06-21 DIAGNOSIS — J019 Acute sinusitis, unspecified: Secondary | ICD-10-CM | POA: Diagnosis not present

## 2017-08-15 DIAGNOSIS — J069 Acute upper respiratory infection, unspecified: Secondary | ICD-10-CM | POA: Diagnosis not present

## 2017-08-15 DIAGNOSIS — Z139 Encounter for screening, unspecified: Secondary | ICD-10-CM | POA: Diagnosis not present

## 2017-08-15 DIAGNOSIS — E039 Hypothyroidism, unspecified: Secondary | ICD-10-CM | POA: Diagnosis not present

## 2017-10-18 DIAGNOSIS — H6980 Other specified disorders of Eustachian tube, unspecified ear: Secondary | ICD-10-CM | POA: Diagnosis not present

## 2017-10-18 DIAGNOSIS — H9209 Otalgia, unspecified ear: Secondary | ICD-10-CM | POA: Diagnosis not present

## 2017-10-18 DIAGNOSIS — J309 Allergic rhinitis, unspecified: Secondary | ICD-10-CM | POA: Diagnosis not present

## 2018-01-25 DIAGNOSIS — H5203 Hypermetropia, bilateral: Secondary | ICD-10-CM | POA: Diagnosis not present

## 2018-10-10 DIAGNOSIS — E039 Hypothyroidism, unspecified: Secondary | ICD-10-CM | POA: Diagnosis not present

## 2018-10-10 DIAGNOSIS — M109 Gout, unspecified: Secondary | ICD-10-CM | POA: Diagnosis not present

## 2018-10-13 ENCOUNTER — Ambulatory Visit (INDEPENDENT_AMBULATORY_CARE_PROVIDER_SITE_OTHER): Payer: BLUE CROSS/BLUE SHIELD | Admitting: Family

## 2018-10-13 ENCOUNTER — Other Ambulatory Visit: Payer: Self-pay | Admitting: Family

## 2018-10-13 ENCOUNTER — Encounter: Payer: Self-pay | Admitting: Family

## 2018-10-13 VITALS — BP 118/70 | HR 89 | Temp 98.2°F | Ht 61.5 in | Wt 121.2 lb

## 2018-10-13 DIAGNOSIS — Z0000 Encounter for general adult medical examination without abnormal findings: Secondary | ICD-10-CM

## 2018-10-13 DIAGNOSIS — F411 Generalized anxiety disorder: Secondary | ICD-10-CM

## 2018-10-13 DIAGNOSIS — R140 Abdominal distension (gaseous): Secondary | ICD-10-CM

## 2018-10-13 DIAGNOSIS — K625 Hemorrhage of anus and rectum: Secondary | ICD-10-CM | POA: Diagnosis not present

## 2018-10-13 DIAGNOSIS — R109 Unspecified abdominal pain: Secondary | ICD-10-CM | POA: Diagnosis not present

## 2018-10-13 DIAGNOSIS — Z20828 Contact with and (suspected) exposure to other viral communicable diseases: Secondary | ICD-10-CM

## 2018-10-13 DIAGNOSIS — M25522 Pain in left elbow: Secondary | ICD-10-CM

## 2018-10-13 DIAGNOSIS — Z0001 Encounter for general adult medical examination with abnormal findings: Secondary | ICD-10-CM | POA: Diagnosis not present

## 2018-10-13 DIAGNOSIS — E039 Hypothyroidism, unspecified: Secondary | ICD-10-CM

## 2018-10-13 LAB — COMPREHENSIVE METABOLIC PANEL
ALT: 16 U/L (ref 0–35)
AST: 19 U/L (ref 0–37)
Albumin: 4.6 g/dL (ref 3.5–5.2)
Alkaline Phosphatase: 44 U/L (ref 39–117)
BUN: 10 mg/dL (ref 6–23)
CO2: 29 mEq/L (ref 19–32)
Calcium: 9.9 mg/dL (ref 8.4–10.5)
Chloride: 103 mEq/L (ref 96–112)
Creatinine, Ser: 0.67 mg/dL (ref 0.40–1.20)
GFR: 94.21 mL/min (ref >60.00)
Glucose, Bld: 98 mg/dL (ref 70–99)
Potassium: 4.2 mEq/L (ref 3.5–5.1)
Sodium: 140 mEq/L (ref 135–145)
Total Bilirubin: 0.5 mg/dL (ref 0.2–1.2)
Total Protein: 7.3 g/dL (ref 6.0–8.3)

## 2018-10-13 LAB — CBC WITH DIFFERENTIAL/PLATELET
Basophils Absolute: 0.1 10*3/uL (ref 0.0–0.1)
Basophils Relative: 1.4 % (ref 0.0–3.0)
Eosinophils Absolute: 0.3 10*3/uL (ref 0.0–0.7)
Eosinophils Relative: 3.5 % (ref 0.0–5.0)
HCT: 39.3 % (ref 36.0–46.0)
Hemoglobin: 13.4 g/dL (ref 12.0–15.0)
Lymphocytes Relative: 28 % (ref 12.0–46.0)
Lymphs Abs: 2.1 10*3/uL (ref 0.7–4.0)
MCHC: 34.1 g/dL (ref 30.0–36.0)
MCV: 93.5 fl (ref 78.0–100.0)
Monocytes Absolute: 0.7 10*3/uL (ref 0.1–1.0)
Monocytes Relative: 9.3 % (ref 3.0–12.0)
Neutro Abs: 4.2 10*3/uL (ref 1.4–7.7)
Neutrophils Relative %: 57.8 % (ref 43.0–77.0)
Platelets: 266 10*3/uL (ref 150.0–400.0)
RBC: 4.21 Mil/uL (ref 3.87–5.11)
RDW: 12.2 % (ref 11.5–15.5)
WBC: 7.3 10*3/uL (ref 4.0–10.5)

## 2018-10-13 LAB — LIPID PANEL
Cholesterol: 194 mg/dL (ref 0–200)
HDL: 65.8 mg/dL (ref >39.00)
LDL Cholesterol: 111 mg/dL — ABNORMAL HIGH (ref 0–99)
NonHDL: 128.32
Total CHOL/HDL Ratio: 3
Triglycerides: 86 mg/dL (ref 0.0–149.0)
VLDL: 17.2 mg/dL (ref 0.0–40.0)

## 2018-10-13 LAB — VITAMIN D 25 HYDROXY (VIT D DEFICIENCY, FRACTURES): VITD: 33.69 ng/mL (ref 30.00–100.00)

## 2018-10-13 LAB — URIC ACID: Uric Acid, Serum: 4 mg/dL (ref 2.4–7.0)

## 2018-10-13 LAB — B12 AND FOLATE PANEL
Folate: >24 ng/mL (ref >5.9)
Vitamin B-12: 407 pg/mL (ref 211–911)

## 2018-10-13 LAB — HEMOGLOBIN A1C: Hgb A1c MFr Bld: 5.8 % (ref 4.6–6.5)

## 2018-10-13 MED ORDER — HYDROCORTISONE ACETATE 25 MG RE SUPP: 25.0000 mg | Freq: Two times a day (BID) | RECTAL | 0 refills | Status: DC

## 2018-10-13 MED ORDER — OSELTAMIVIR PHOSPHATE 75 MG PO CAPS: 75.0000 mg | ORAL_CAPSULE | Freq: Two times a day (BID) | ORAL | 0 refills | Status: DC

## 2018-10-13 NOTE — Assessment & Plan Note (Signed)
Follows endocrine.  Deferred TSH.

## 2018-10-13 NOTE — Assessment & Plan Note (Addendum)
Bloating.  History of left ovarian cancer.  Pending Ca1 25, CT abdomen and pelvis.

## 2018-10-13 NOTE — Patient Instructions (Addendum)
No more two drinks on the wellbutrin.   Please order Tamiflu if you need. Please return the stool cards as we discussed today.  Today we discussed referrals, orders. GI consult, Ct abdomen and pelvis   I have placed these orders in the system for you.  Please be sure to give [place] a call if you have not heard from our office regarding this. We should hear from [place] within ONE week with information regarding your appointment. If not, please let me know immediately.   Trial of mobic for suspect bursitis of left elbow. Take with food. Please refrain from resting left elbow on desk/car door.

## 2018-10-13 NOTE — Assessment & Plan Note (Signed)
No obvious hemorrhoids on exam.  Pending stool cards, CBC, referral to gastroenterology.  Trial of Anusol.  Close vigilance

## 2018-10-13 NOTE — Assessment & Plan Note (Signed)
Exposure, currently asymptomatic.  If she  begins have symptoms, she understands to start within 24 hours.

## 2018-10-13 NOTE — Assessment & Plan Note (Addendum)
Suspect olecranon bursitis, and overuse syndrome.  Less likely gout based on presentation today.  However pending uric acid.  Agreed on conservative management at this time with a trial of meloxicam, icing regimen.  Patient let me know if no improvement and we  will consult orthopedics.

## 2018-10-13 NOTE — Progress Notes (Signed)
Subjective:    Patient ID: Emily Knox, female    DOB: 01-Sep-1971, 48 y.o.   MRN: [id number]  CC: Emily Knox is a 48 y.o. female who presents today for physical exam.    HPI: H/o left ovarian cancer; feels bloated. No weight gain, abdominal pain.   Rectal bleeding x couple months, unchanged.  Felt a small mass. Non tender. Not straining. No constipation. BR on toilet paper or drop in toilet.  Following Gluten free diet as helps with abdominal bloating. Grandmother has colon cancer.   Left elbow pain for a couple of months, unchanged. Pain with holding drink and moving up /down or lateral.  H/o gout. Numbness from elbow to entire hand. No swelling, fever. Right handed. No repetive movements at work. No h/o GIB  Hypothyroidism-  Following with endocrine.   Depression-Wellbutrin; feels well on medication.   Started to have hot flashes. Menses come every month.   48 year old daughter diagnosed with flu yesterday.  She is currently on Tamiflu.  Patient denies myalgias, fever   Colorectal Cancer Screening: No early family history of colon cancer.  Breast Cancer Screening: Mammogram due Cervical Cancer Screening: not due;  last 2018 Bone Health screening/DEXA for 65+: No increased fracture risk. Defer screening at this time. Lung Cancer Screening: Doesn't have 30 year pack year history and age > 55 years. Immunizations       Tetanus - due        Pneumococcal - Candidate for.   Labs: Screening labs today. Exercise: Gets regular exercise.  Alcohol use: Couple of times per month, 3 three 12 oz beers maximum.  Smoking/tobacco use: smoker.  Regular dental exams: UTD Wears seat belt: Yes.  HISTORY:  Past Medical History:  Diagnosis Date  . Allergy   . Anxiety    panic attacks  . Cough    due to allergies  . Dysrhythmia    palpatations - due to thyroid  . GERD (gastroesophageal reflux disease)   . Hyperlipidemia   . Hypothyroidism   . Low back strain   .  Lyme disease    2010  . Motion sickness    all moving vehicles  . Ovarian cancer ([place]) 1991   left ovary  . Seasonal allergies     Past Surgical History:  Procedure Laterality Date  . LEFT OOPHORECTOMY    . MASS BIOPSY N/A 01/31/2015   Procedure: NECK MASS BIOPSY;  Surgeon: Beverly Gust, MD;  Location: [facility];  Service: ENT;  Laterality: N/A;   Family History  Problem Relation Age of Onset  . COPD Father   . Colon cancer Paternal Grandmother 46  . Ovarian cancer Neg Hx       ALLERGIES: Onion; Peanuts [peanut oil]; and Tomato  Current Outpatient Medications on File Prior to Visit  Medication Sig Dispense Refill  . buPROPion (WELLBUTRIN XL) 150 MG 24 hr tablet take 1 tablet by mouth once daily    . cephALEXin (KEFLEX) 500 MG capsule Take 1 capsule (500 mg total) by mouth 2 (two) times daily. 10 capsule 0  . Nutritional Supplements (JUICE PLUS FIBRE PO) Take by mouth daily.    Marland Kitchen SYNTHROID 100 MCG tablet   3   No current facility-administered medications on file prior to visit.     Social History   Tobacco Use  . Smoking status: Current Every Day Smoker    Packs/day: 1.00    Years: 20.00    Pack years: 20.00  .  Smokeless tobacco: Never Used  Substance Use Topics  . Alcohol use: Yes    Alcohol/week: 4.0 standard drinks    Types: 4 Cans of beer per week  . Drug use: No    Review of Systems  Constitutional: Negative for chills, fever and unexpected weight change.  HENT: Negative for congestion.   Respiratory: Negative for cough.   Cardiovascular: Negative for chest pain, palpitations and leg swelling.  Gastrointestinal: Positive for abdominal distention and anal bleeding. Negative for abdominal pain, blood in stool, constipation, diarrhea, nausea and vomiting.  Musculoskeletal: Positive for arthralgias. Negative for joint swelling and myalgias.  Skin: Negative for rash.  Neurological: Negative for headaches.  Hematological: Negative for adenopathy.    Psychiatric/Behavioral: Negative for confusion.      Objective:    BP 118/70 (BP Location: Left Arm, Patient Position: Sitting, Cuff Size: Normal)   Pulse 89   Temp 98.2 F (36.8 C)   Ht 5' 1.5" (1.562 m)   Wt 121 lb 3.2 oz (55 kg)   SpO2 98%   BMI 22.53 kg/m   BP Readings from Last 3 Encounters:  10/13/18 118/70  04/14/17 110/70  01/31/15 110/71   Wt Readings from Last 3 Encounters:  10/13/18 121 lb 3.2 oz (55 kg)  04/21/17 118 lb 3.2 oz (53.6 kg)  04/14/17 118 lb (53.5 kg)    Physical Exam Vitals signs reviewed.  Constitutional:      Appearance: She is well-developed.  Eyes:     Conjunctiva/sclera: Conjunctivae normal.  Neck:     Thyroid: No thyroid mass or thyromegaly.  Cardiovascular:     Rate and Rhythm: Normal rate and regular rhythm.     Pulses: Normal pulses.     Heart sounds: Normal heart sounds.  Pulmonary:     Effort: Pulmonary effort is normal.     Breath sounds: Normal breath sounds. No wheezing, rhonchi or rales.  Chest:     Breasts: Breasts are symmetrical.        Right: No inverted nipple, mass, nipple discharge, skin change or tenderness.        Left: No inverted nipple, mass, nipple discharge, skin change or tenderness.  Genitourinary:    Comments: No hemorrhoids appreciated on exam.  Musculoskeletal:     Left elbow: She exhibits normal range of motion, no swelling, no effusion and no deformity. No tenderness found.     Comments: Full ROM with flexion, extension, supination, and pronation. Pain with resisted supination and lateral movements.  Strength 5/5. No ecchymosis or swelling.   Lymphadenopathy:     Head:     Right side of head: No submental, submandibular, tonsillar, preauricular, posterior auricular or occipital adenopathy.     Left side of head: No submental, submandibular, tonsillar, preauricular, posterior auricular or occipital adenopathy.     Cervical: No cervical adenopathy.     Right cervical: No superficial, deep or  posterior cervical adenopathy.    Left cervical: No superficial, deep or posterior cervical adenopathy.  Skin:    General: Skin is warm and dry.  Neurological:     Mental Status: She is alert.  Psychiatric:        Speech: Speech normal.        Behavior: Behavior normal.        Thought Content: Thought content normal.        Assessment & Plan:   Problem List Items Addressed This Visit      Digestive   Rectal bleeding    No  obvious hemorrhoids on exam.  Pending stool cards, CBC, referral to gastroenterology.  Trial of Anusol.  Close vigilance      Relevant Medications   hydrocortisone (ANUSOL-HC) 25 MG suppository   Other Relevant Orders   Fecal occult blood, imunochemical   Ambulatory referral to Gastroenterology     Endocrine   Hypothyroidism    Follows endocrine.  Deferred TSH.        Other   Routine physical examination - Primary    Clinical breast exam performed today.  Deferred pelvic exam in the absence of complaints, Pap smear is up-to-date.  Mammogram ordered.  Patient understands to schedule.      Relevant Orders   CBC with Differential/Platelet   Comprehensive metabolic panel   Hemoglobin A1c   Lipid panel   VITAMIN D 25 Hydroxy (Vit-D Deficiency, Fractures)   B12 and Folate Panel   MM 3D SCREEN BREAST BILATERAL   GAD (generalized anxiety disorder)    Doing well on Wellbutrin, will continue.  Discussed lowering seizure threshold with alcohol use, strongly advised her to limit drinks to 1-2 alcoholic drinks in one  sitting.  Patient verbalized understanding.      Exposure to the flu    Exposure, currently asymptomatic.  If she  begins have symptoms, she understands to start within 24 hours.      Relevant Medications   oseltamivir (TAMIFLU) 75 MG capsule   Bloating    Bloating.  History of left ovarian cancer.  Pending Ca1 25, CT abdomen and pelvis.      Left elbow pain    Suspect olecranon bursitis, and overuse syndrome.  Less likely gout based  on presentation today.  However pending uric acid.  Agreed on conservative management at this time with a trial of meloxicam, icing regimen.  Patient let me know if no improvement and we  will consult orthopedics.      Relevant Orders   Uric acid       I have discontinued Henleigh L. Eicher's levothyroxine. I am also having her start on hydrocortisone and oseltamivir. Additionally, I am having her maintain her Nutritional Supplements (JUICE PLUS FIBRE PO), cephALEXin, buPROPion, and SYNTHROID.   Meds ordered this encounter  Medications  . hydrocortisone (ANUSOL-HC) 25 MG suppository    Sig: Place 1 suppository (25 mg total) rectally 2 (two) times daily.    Dispense:  12 suppository    Refill:  0    Order Specific Question:   Supervising Provider    Answer:   Derrel Nip, TERESA L [[id number]]  . oseltamivir (TAMIFLU) 75 MG capsule    Sig: Take 1 capsule (75 mg total) by mouth 2 (two) times daily.    Dispense:  10 capsule    Refill:  0    Order Specific Question:   Supervising Provider    Answer:   Crecencio Mc [[id number]]    Return precautions given.   Risks, benefits, and alternatives of the medications and treatment plan prescribed today were discussed, and patient expressed understanding.   Education regarding symptom management and diagnosis given to patient on AVS.   Continue to follow with Burnard Hawthorne, FNP for routine health maintenance.   Emily Knox and I agreed with plan.   Mable Paris, FNP

## 2018-10-13 NOTE — Assessment & Plan Note (Signed)
Doing well on Wellbutrin, will continue.  Discussed lowering seizure threshold with alcohol use, strongly advised her to limit drinks to 1-2 alcoholic drinks in one  sitting.  Patient verbalized understanding.

## 2018-10-13 NOTE — Assessment & Plan Note (Signed)
Clinical breast exam performed today.  Deferred pelvic exam in the absence of complaints, Pap smear is up-to-date.  Mammogram ordered.  Patient understands to schedule.

## 2018-10-16 ENCOUNTER — Encounter: Payer: Self-pay | Admitting: *Deleted

## 2018-10-16 LAB — CA 125: CA 125: 4 U/mL (ref <35)

## 2018-10-23 ENCOUNTER — Ambulatory Visit
Admission: RE | Admit: 2018-10-23 | Discharge: 2018-10-23 | Disposition: A | Discharge status: Home / Self Care | Payer: BLUE CROSS/BLUE SHIELD | Source: Ambulatory Visit | Attending: Family | Admitting: Family

## 2018-10-23 ENCOUNTER — Encounter: Payer: Self-pay | Admitting: Family

## 2018-10-23 DIAGNOSIS — R140 Abdominal distension (gaseous): Secondary | ICD-10-CM

## 2018-10-24 ENCOUNTER — Ambulatory Visit
Admission: RE | Admit: 2018-10-24 | Discharge: 2018-10-24 | Disposition: A | Discharge status: Home / Self Care | Payer: BLUE CROSS/BLUE SHIELD | Source: Ambulatory Visit | Attending: Family | Admitting: Family

## 2018-10-24 DIAGNOSIS — R140 Abdominal distension (gaseous): Secondary | ICD-10-CM | POA: Insufficient documentation

## 2018-10-24 MED ORDER — IOPAMIDOL (ISOVUE-300) INJECTION 61%
75.0000 mL | Freq: Once | INTRAVENOUS | Status: AC | PRN
  Administered 2018-10-24: 75 mL via INTRAVENOUS

## 2018-10-30 DIAGNOSIS — Z0100 Encounter for examination of eyes and vision without abnormal findings: Secondary | ICD-10-CM | POA: Diagnosis not present

## 2018-10-30 DIAGNOSIS — H5203 Hypermetropia, bilateral: Secondary | ICD-10-CM | POA: Diagnosis not present

## 2018-11-21 ENCOUNTER — Ambulatory Visit: Payer: BLUE CROSS/BLUE SHIELD | Admitting: Gastroenterology

## 2018-11-29 DIAGNOSIS — M7712 Lateral epicondylitis, left elbow: Secondary | ICD-10-CM | POA: Diagnosis not present

## 2018-12-06 ENCOUNTER — Encounter: Payer: Self-pay | Admitting: Gastroenterology

## 2018-12-06 ENCOUNTER — Other Ambulatory Visit: Payer: Self-pay

## 2018-12-06 ENCOUNTER — Ambulatory Visit: Payer: BLUE CROSS/BLUE SHIELD | Admitting: Gastroenterology

## 2018-12-06 VITALS — BP 109/73 | HR 91 | Resp 17 | Ht 61.5 in | Wt 124.4 lb

## 2018-12-06 DIAGNOSIS — K625 Hemorrhage of anus and rectum: Secondary | ICD-10-CM

## 2018-12-06 NOTE — Progress Notes (Addendum)
Cephas Darby, MD [address]  [place]  [place], [place] [address]  Main: [phone]  Fax: [phone]    Gastroenterology Consultation  Referring Provider:     Burnard Hawthorne, FNP Primary Care Physician:  Burnard Hawthorne, FNP Primary Gastroenterologist:  Dr. Cephas Darby Reason for Consultation:     Rectal bleeding        HPI:   Emily Knox is a 48 y.o. female referred by Dr. Burnard Hawthorne, FNP  for consultation & management of rectal bleeding.  She has been noticing bright red blood per rectum, only on wiping for the last 3 months or so.  She reports that in the beginning it was more frequent, currently is about once a week or even less than a week.  She denies any other GI symptoms.  CBC unremarkable, she underwent CT abdomen and pelvis due to history of ovarian cancer and it was unremarkable with no recurrence of the disease.  She denies weight loss, nausea or vomiting.  She does smoke cigarettes.  She reports regular bowel movements, denies constipation or diarrhea, altered bowel habits or change in stool caliber  NSAIDs: None  Antiplts/Anticoagulants/Anti thrombotics: None  GI Procedures: None She denies family history of GI malignancy  Past Medical History:  Diagnosis Date  . Allergy   . Anxiety    panic attacks  . Cough    due to allergies  . Dysrhythmia    palpatations - due to thyroid  . GERD (gastroesophageal reflux disease)   . Hyperlipidemia   . Hypothyroidism   . Low back strain   . Lyme disease    2010  . Motion sickness    all moving vehicles  . Ovarian cancer ([place]) 1991   Left Oophorectomy  . Seasonal allergies     Past Surgical History:  Procedure Laterality Date  . LEFT OOPHORECTOMY    . MASS BIOPSY N/A 01/31/2015   Procedure: NECK MASS BIOPSY;  Surgeon: Beverly Gust, MD;  Location: [facility];  Service: ENT;  Laterality: N/A;    Current Outpatient Medications:  .  buPROPion (WELLBUTRIN XL) 150  MG 24 hr tablet, take 1 tablet by mouth once daily, Disp: , Rfl:  .  predniSONE (DELTASONE) 10 MG tablet, , Disp: , Rfl:  .  SYNTHROID 100 MCG tablet, , Disp: , Rfl: 3    Family History  Problem Relation Age of Onset  . COPD Father   . Colon cancer Paternal Grandmother 75  . Ovarian cancer Neg Hx      Social History   Tobacco Use  . Smoking status: Current Every Day Smoker    Packs/day: 1.00    Years: 20.00    Pack years: 20.00  . Smokeless tobacco: Never Used  Substance Use Topics  . Alcohol use: Yes    Alcohol/week: 4.0 standard drinks    Types: 4 Cans of beer per week  . Drug use: No    Allergies as of 12/06/2018 - Review Complete 12/06/2018  Allergen Reaction Noted  . Onion Hives 01/27/2015  . Peanuts [peanut oil] Hives 01/27/2015  . Tomato Hives 01/27/2015    Review of Systems:    All systems reviewed and negative except where noted in HPI.   Physical Exam:  BP 109/73 (BP Location: Left Arm, Patient Position: Sitting, Cuff Size: Normal)   Pulse 91   Resp 17   Ht 5' 1.5" (1.562 m)   Wt 124 lb 6.4 oz (56.4  kg)   BMI 23.12 kg/m  No LMP recorded.  General:   Alert,  Well-developed, well-nourished, pleasant and cooperative in NAD Head:  Normocephalic and atraumatic. Eyes:  Sclera clear, no icterus.   Conjunctiva pink. Ears:  Normal auditory acuity. Nose:  No deformity, discharge, or lesions. Mouth:  No deformity or lesions,oropharynx pink & moist. Neck:  Supple; no masses or thyromegaly. Lungs:  Respirations even and unlabored.  Clear throughout to auscultation.   No wheezes, crackles, or rhonchi. No acute distress. Heart:  Regular rate and rhythm; no murmurs, clicks, rubs, or gallops. Abdomen:  Normal bowel sounds. Soft, non-tender and non-distended without masses, hepatosplenomegaly or hernias noted.  No guarding or rebound tenderness.   Rectal: Sentinel skin tag, nontender rectal exam, no palpable lesions in the distal rectum or anal canal except external  hemorrhoids Msk:  Symmetrical without gross deformities. Good, equal movement & strength bilaterally. Pulses:  Normal pulses noted. Extremities:  No clubbing or edema.  No cyanosis. Neurologic:  Alert and oriented x3;  grossly normal neurologically. Skin:  Intact without significant lesions or rashes. No jaundice. Psych:  Alert and cooperative. Normal mood and affect.  Imaging Studies: Reviewed  Assessment and Plan:   Emily Knox is a 48 y.o. female with history of hypothyroidism seen in consultation for chronic intermittent painless bright red bleeding per rectum, primarily on wiping.  Her symptoms are most likely secondary to external hemorrhoids.  Suggested her to try Preparation H or hydrocortisone hemorrhoidal cream 2 times a day for about 2 weeks.  Given her age, I recommend diagnostic colonoscopy.  I discussed with her about outpatient hemorrhoid ligation if her symptoms are persistent if the colonoscopy is unremarkable.  Given the healthcare crisis due to COVID-19, all the nonurgent, elective procedures have been postponed in the entire [facility] system for next 30days. We will follow-up with patient in 1-2 months to schedule diagnostic colonoscopy and I also advised patient to call our office back in summer.   Follow up in 1-2 months   Cephas Darby, MD

## 2018-12-12 ENCOUNTER — Other Ambulatory Visit: Payer: Self-pay

## 2018-12-12 DIAGNOSIS — K625 Hemorrhage of anus and rectum: Secondary | ICD-10-CM

## 2019-01-17 ENCOUNTER — Encounter: Payer: Self-pay | Admitting: Family

## 2019-01-17 ENCOUNTER — Ambulatory Visit (INDEPENDENT_AMBULATORY_CARE_PROVIDER_SITE_OTHER): Payer: BLUE CROSS/BLUE SHIELD | Admitting: Family

## 2019-01-17 DIAGNOSIS — F411 Generalized anxiety disorder: Secondary | ICD-10-CM

## 2019-01-17 DIAGNOSIS — Z720 Tobacco use: Secondary | ICD-10-CM | POA: Insufficient documentation

## 2019-01-17 DIAGNOSIS — K625 Hemorrhage of anus and rectum: Secondary | ICD-10-CM

## 2019-01-17 DIAGNOSIS — R140 Abdominal distension (gaseous): Secondary | ICD-10-CM

## 2019-01-17 DIAGNOSIS — M25522 Pain in left elbow: Secondary | ICD-10-CM

## 2019-01-17 NOTE — Progress Notes (Signed)
This visit type was conducted due to national recommendations for restrictions regarding the COVID-19 pandemic (e.g. social distancing).  This format is felt to be most appropriate for this patient at this time.  All issues noted in this document were discussed and addressed.  No physical exam was performed (except for noted visual exam findings with Video Visits). Virtual Visit via Video Note  I connected with@  on 01/17/19 at  8:30 AM EDT by a video enabled telemedicine application and verified that I am speaking with the correct person using two identifiers.  Location patient: home Location provider:work Persons participating in the virtual visit: patient, provider  I discussed the limitations of evaluation and management by telemedicine and the availability of in person appointments. The patient expressed understanding and agreed to proceed.   HPI:  Feels well today, no new complaints.   Rectal bleeding- has 'become very rare'. Has see Dr Marius Ditch. Colonoscopy scheduled per patient.   GAD- doing well on wellbutrin 150mg .   Bloating-Some improvement. Comes and goes based on what eats. Celiac negative per patient.  Ct Ab negative 10/2018.  Left elbow pain- has seen Dr Prescott Parma. Likely will go.  Using CBD oil ( 300mg ) drops by mouth to quit smoking. Taking hours after medication.   ROS: See pertinent positives and negatives per HPI.  Past Medical History:  Diagnosis Date  . Allergy   . Anxiety    panic attacks  . Cough    due to allergies  . Dysrhythmia    palpatations - due to thyroid  . GERD (gastroesophageal reflux disease)   . Hyperlipidemia   . Hypothyroidism   . Low back strain   . Lyme disease    2010  . Motion sickness    all moving vehicles  . Ovarian cancer ([place]) 1991   Left Oophorectomy  . Seasonal allergies     Past Surgical History:  Procedure Laterality Date  . LEFT OOPHORECTOMY    . MASS BIOPSY N/A 01/31/2015   Procedure: NECK MASS BIOPSY;  Surgeon:  Beverly Gust, MD;  Location: [facility];  Service: ENT;  Laterality: N/A;    Family History  Problem Relation Age of Onset  . COPD Father   . Colon cancer Paternal Grandmother 17  . Ovarian cancer Neg Hx     SOCIAL HX: smoker   Current Outpatient Medications:  .  buPROPion (WELLBUTRIN XL) 150 MG 24 hr tablet, take 1 tablet by mouth once daily, Disp: , Rfl:  .  SYNTHROID 100 MCG tablet, , Disp: , Rfl: 3  EXAM:  VITALS per patient if applicable:  GENERAL: alert, oriented, appears well and in no acute distress  HEENT: atraumatic, conjunttiva clear, no obvious abnormalities on inspection of external nose and ears  NECK: normal movements of the head and neck  LUNGS: on inspection no signs of respiratory distress, breathing rate appears normal, no obvious gross SOB, gasping or wheezing  CV: no obvious cyanosis  MS: moves all visible extremities without noticeable abnormality  PSYCH/NEURO: pleasant and cooperative, no obvious depression or anxiety, speech and thought processing grossly intact  ASSESSMENT AND PLAN:  Discussed the following assessment and plan:  Rectal bleeding  GAD (generalized anxiety disorder)  Left elbow pain  Bloating  Smoking trying to quit  Problem List Items Addressed This Visit      Digestive   Rectal bleeding    Improved, established with GI and pending colonoscopy.  Patient will let me know of any further issues  Other   GAD (generalized anxiety disorder)    Doing well on Wellbutrin, will continue      Bloating    Some improvement.  Reviewed CT abdomen pelvis the patient again this morning.  She quite declines any further work-up at this time such as celiac labs.  I did advise her to speak with Dr. Marius Ditch in regards to this symptom prior to her colonoscopy.  Patient verbalized understanding.      Left elbow pain    Largely unchanged.  Prednisone given by orthopedics.  Patient states she will follow-up with  orthopedics for likely injection.  She will let me know of any concerns      Smoking trying to quit    Patient is using CBD oil for cessation of smoking.  Advised her that I do not know of any known risk in regards this medication, especially in conjunction with her other medication.  I have messaged our pharmacist, Catie in regards to any further advice she may have in regard to safety.            I discussed the assessment and treatment plan with the patient. The patient was provided an opportunity to ask questions and all were answered. The patient agreed with the plan and demonstrated an understanding of the instructions.   The patient was advised to call back or seek an in-person evaluation if the symptoms worsen or if the condition fails to improve as anticipated.   Mable Paris, FNP

## 2019-01-17 NOTE — Assessment & Plan Note (Signed)
Largely unchanged.  Prednisone given by orthopedics.  Patient states she will follow-up with orthopedics for likely injection.  She will let me know of any concerns

## 2019-01-17 NOTE — Assessment & Plan Note (Signed)
Patient is using CBD oil for cessation of smoking.  Advised her that I do not know of any known risk in regards this medication, especially in conjunction with her other medication.  I have messaged our pharmacist, Catie in regards to any further advice she may have in regard to safety.

## 2019-01-17 NOTE — Assessment & Plan Note (Signed)
Some improvement.  Reviewed CT abdomen pelvis the patient again this morning.  She quite declines any further work-up at this time such as celiac labs.  I did advise her to speak with Dr. Marius Ditch in regards to this symptom prior to her colonoscopy.  Patient verbalized understanding.

## 2019-01-17 NOTE — Assessment & Plan Note (Signed)
Improved, established with GI and pending colonoscopy.  Patient will let me know of any further issues

## 2019-01-17 NOTE — Assessment & Plan Note (Signed)
Doing well on Wellbutrin , will continue.  

## 2019-01-24 ENCOUNTER — Telehealth: Payer: Self-pay | Admitting: Family

## 2019-01-24 ENCOUNTER — Encounter: Payer: Self-pay | Admitting: Family

## 2019-01-24 NOTE — Telephone Encounter (Signed)
close

## 2019-01-30 DIAGNOSIS — M7712 Lateral epicondylitis, left elbow: Secondary | ICD-10-CM | POA: Diagnosis not present

## 2019-02-22 ENCOUNTER — Other Ambulatory Visit: Admission: RE | Admit: 2019-02-22 | Payer: BLUE CROSS/BLUE SHIELD | Source: Ambulatory Visit

## 2019-02-23 ENCOUNTER — Other Ambulatory Visit
Admission: RE | Admit: 2019-02-23 | Discharge: 2019-02-23 | Disposition: A | Discharge status: Home / Self Care | Payer: BLUE CROSS/BLUE SHIELD | Source: Ambulatory Visit | Attending: Gastroenterology | Admitting: Gastroenterology

## 2019-02-23 ENCOUNTER — Other Ambulatory Visit: Payer: Self-pay

## 2019-02-23 DIAGNOSIS — Z01812 Encounter for preprocedural laboratory examination: Secondary | ICD-10-CM | POA: Diagnosis not present

## 2019-02-23 DIAGNOSIS — Z1159 Encounter for screening for other viral diseases: Secondary | ICD-10-CM | POA: Insufficient documentation

## 2019-02-24 LAB — NOVEL CORONAVIRUS, NAA (HOSP ORDER, SEND-OUT TO REF LAB; TAT 18-24 HRS): SARS-CoV-2, NAA: NOT DETECTED

## 2019-02-27 ENCOUNTER — Ambulatory Visit
Admission: RE | Admit: 2019-02-27 | Discharge: 2019-02-27 | Disposition: A | Discharge status: Home / Self Care | Payer: BLUE CROSS/BLUE SHIELD | Attending: Gastroenterology | Admitting: Gastroenterology

## 2019-02-27 ENCOUNTER — Other Ambulatory Visit: Payer: Self-pay

## 2019-02-27 ENCOUNTER — Ambulatory Visit: Payer: BLUE CROSS/BLUE SHIELD | Admitting: Anesthesiology

## 2019-02-27 ENCOUNTER — Encounter
Admission: RE | Disposition: A | Discharge status: Home / Self Care | Payer: Self-pay | Source: Home / Self Care | Attending: Gastroenterology

## 2019-02-27 DIAGNOSIS — K644 Residual hemorrhoidal skin tags: Secondary | ICD-10-CM | POA: Insufficient documentation

## 2019-02-27 DIAGNOSIS — Z793 Long term (current) use of hormonal contraceptives: Secondary | ICD-10-CM | POA: Insufficient documentation

## 2019-02-27 DIAGNOSIS — Z91018 Allergy to other foods: Secondary | ICD-10-CM | POA: Diagnosis not present

## 2019-02-27 DIAGNOSIS — Z8543 Personal history of malignant neoplasm of ovary: Secondary | ICD-10-CM | POA: Diagnosis not present

## 2019-02-27 DIAGNOSIS — Z800 Family history of malignant neoplasm of digestive organs: Secondary | ICD-10-CM | POA: Diagnosis not present

## 2019-02-27 DIAGNOSIS — Z91010 Allergy to peanuts: Secondary | ICD-10-CM | POA: Diagnosis not present

## 2019-02-27 DIAGNOSIS — F17210 Nicotine dependence, cigarettes, uncomplicated: Secondary | ICD-10-CM | POA: Insufficient documentation

## 2019-02-27 DIAGNOSIS — Z79899 Other long term (current) drug therapy: Secondary | ICD-10-CM | POA: Insufficient documentation

## 2019-02-27 DIAGNOSIS — E039 Hypothyroidism, unspecified: Secondary | ICD-10-CM | POA: Insufficient documentation

## 2019-02-27 DIAGNOSIS — K625 Hemorrhage of anus and rectum: Secondary | ICD-10-CM | POA: Insufficient documentation

## 2019-02-27 DIAGNOSIS — D124 Benign neoplasm of descending colon: Secondary | ICD-10-CM | POA: Insufficient documentation

## 2019-02-27 DIAGNOSIS — K635 Polyp of colon: Secondary | ICD-10-CM | POA: Diagnosis not present

## 2019-02-27 HISTORY — PX: COLONOSCOPY WITH PROPOFOL: SHX5780

## 2019-02-27 LAB — POCT PREGNANCY, URINE: Preg Test, Ur: NEGATIVE

## 2019-02-27 SURGERY — COLONOSCOPY WITH PROPOFOL
Anesthesia: General

## 2019-02-27 MED ORDER — PROPOFOL 500 MG/50ML IV EMUL
INTRAVENOUS | Status: AC
  Filled 2019-02-27: qty 50

## 2019-02-27 MED ORDER — PROPOFOL 10 MG/ML IV BOLUS
INTRAVENOUS | Status: DC | PRN
  Administered 2019-02-27: 20 mg via INTRAVENOUS
  Administered 2019-02-27: 50 mg via INTRAVENOUS

## 2019-02-27 MED ORDER — PROPOFOL 500 MG/50ML IV EMUL
INTRAVENOUS | Status: DC | PRN
  Administered 2019-02-27: 125 ug/kg/min via INTRAVENOUS

## 2019-02-27 MED ORDER — SODIUM CHLORIDE 0.9 % IV SOLN
INTRAVENOUS | Status: DC
  Administered 2019-02-27: 1000 mL via INTRAVENOUS

## 2019-02-27 NOTE — Anesthesia Post-op Follow-up Note (Signed)
Anesthesia QCDR form completed.        

## 2019-02-27 NOTE — Op Note (Signed)
[facility]enter[facility] Patient Name: Emily Knox Procedure Date: 02/27/2019 12:50 PM MRN: [id number] Account #: [id number] Date of Birth: 1970-11-23 Admit Type: Outpatient Age: 48 Room: [facility] ENDO ROOM 4 Gender: Female Note Status: Finalized Procedure:            Colonoscopy Indications:          Rectal bleeding Providers:            Lin Landsman MD, MD Referring MD:         Yvetta Coder. Arnett (Referring MD) Medicines:            Monitored Anesthesia Care Complications:        No immediate complications. Estimated blood loss: None. Procedure:            Pre-Anesthesia Assessment:                       - Prior to the procedure, a History and Physical was                        performed, and patient medications and allergies were                        reviewed. The patient is competent. The risks and                        benefits of the procedure and the sedation options and                        risks were discussed with the patient. All questions                        were answered and informed consent was obtained.                        Patient identification and proposed procedure were                        verified by the physician, the nurse, the                        anesthesiologist, the anesthetist and the technician in                        the pre-procedure area in the procedure room in the                        endoscopy suite. Mental Status Examination: alert and                        oriented. Airway Examination: normal oropharyngeal                        airway and neck mobility. Respiratory Examination:                        clear to auscultation. CV Examination: normal.                        Prophylactic Antibiotics: The patient does not require  prophylactic antibiotics. Prior Anticoagulants: The                        patient has taken no previous anticoagulant or   antiplatelet agents. ASA Grade Assessment: II - A                        patient with mild systemic disease. After reviewing the                        risks and benefits, the patient was deemed in                        satisfactory condition to undergo the procedure. The                        anesthesia plan was to use monitored anesthesia care                        (MAC). Immediately prior to administration of                        medications, the patient was re-assessed for adequacy                        to receive sedatives. The heart rate, respiratory rate,                        oxygen saturations, blood pressure, adequacy of                        pulmonary ventilation, and response to care were                        monitored throughout the procedure. The physical status                        of the patient was re-assessed after the procedure.                       After obtaining informed consent, the colonoscope was                        passed under direct vision. Throughout the procedure,                        the patient's blood pressure, pulse, and oxygen                        saturations were monitored continuously. The                        Colonoscope was introduced through the anus and                        advanced to the the cecum, identified by the                        appendiceal orifice, IC valve and transillumination.  The colonoscopy was performed without difficulty. The                        patient tolerated the procedure well. The quality of                        the bowel preparation was evaluated using the BBPS                        [facility] Bowel Preparation Scale) with scores of: Right                        Colon = 3, Transverse Colon = 3 and Left Colon = 3                        (entire mucosa seen well with no residual staining,                        small fragments of stool or opaque liquid). The total                         BBPS score equals 9. Findings:      Hemorrhoids were found on perianal exam.      The terminal ileum appeared normal.      Three sessile polyps were found in the descending colon. The polyps were       5 mm in size. These polyps were removed with a cold snare. Resection and       retrieval were complete.      Non-bleeding external hemorrhoids were found during retroflexion. The       hemorrhoids were large. Impression:           - Hemorrhoids found on perianal exam.                       - The examined portion of the ileum was normal.                       - Three 5 mm polyps in the descending colon, removed                        with a cold snare. Resected and retrieved.                       - Non-bleeding external hemorrhoids. Recommendation:       - Discharge patient to home (with escort).                       - Resume regular diet today.                       - Continue present medications.                       - Await pathology results.                       - Repeat colonoscopy in 7 years for surveillance of                        multiple  polyps.                       - Return to my office in 2 weeks. Procedure Code(s):    --- Professional ---                       [phone], Colonoscopy, flexible; with removal of tumor(s),                        polyp(s), or other lesion(s) by snare technique Diagnosis Code(s):    --- Professional ---                       K64.4, Residual hemorrhoidal skin tags                       K63.5, Polyp of colon                       K62.5, Hemorrhage of anus and rectum CPT copyright 2019 American Medical Association. All rights reserved. The codes documented in this report are preliminary and upon coder review may  be revised to meet current compliance requirements. Dr. Ulyess Mort Lin Landsman MD, MD 02/27/2019 1:15:39 PM This report has been signed electronically. Number of Addenda: 0 Note Initiated On: 02/27/2019 12:50 PM Scope  Withdrawal Time: 0 hours 11 minutes 17 seconds  Total Procedure Duration: 0 hours 14 minutes 59 seconds  Estimated Blood Loss: Estimated blood loss: none.      [facility]

## 2019-02-27 NOTE — H&P (Signed)
Cephas Darby, MD [address]  [place]  [place], [place] [address]  Main: [phone]  Fax: [phone] Pager: [phone]  Primary Care Physician:  Burnard Hawthorne, FNP Primary Gastroenterologist:  Dr. Cephas Darby  Pre-Procedure History & Physical: HPI:  Emily Knox is a 48 y.o. female is here for an colonoscopy.   Past Medical History:  Diagnosis Date  . Allergy   . Anxiety    panic attacks  . Cough    due to allergies  . Dysrhythmia    palpatations - due to thyroid  . GERD (gastroesophageal reflux disease)   . Hyperlipidemia   . Hypothyroidism   . Low back strain   . Lyme disease    2010  . Motion sickness    all moving vehicles  . Ovarian cancer ([place]) 1991   Left Oophorectomy  . Seasonal allergies     Past Surgical History:  Procedure Laterality Date  . LEFT OOPHORECTOMY    . MASS BIOPSY N/A 01/31/2015   Procedure: NECK MASS BIOPSY;  Surgeon: Beverly Gust, MD;  Location: [place];  Service: ENT;  Laterality: N/A;    Prior to Admission medications   Medication Sig Start Date End Date Taking? Authorizing Provider  buPROPion (WELLBUTRIN XL) 150 MG 24 hr tablet take 1 tablet by mouth once daily 08/17/16  Yes [provider]  SYNTHROID 100 MCG tablet  04/04/17  Yes [provider]    Allergies as of 12/12/2018 - Review Complete 12/06/2018  Allergen Reaction Noted  . Onion Hives 01/27/2015  . Peanuts [peanut oil] Hives 01/27/2015  . Tomato Hives 01/27/2015    Family History  Problem Relation Age of Onset  . COPD Father   . Colon cancer Paternal Grandmother 16  . Ovarian cancer Neg Hx     Social History   Socioeconomic History  . Marital status: Married    Spouse name: Not on file  . Number of children: Not on file  . Years of education: Not on file  . Highest education level: Not on file  Occupational History  . Not on file  Social Needs  . Financial resource strain: Not on file  . Food  insecurity:    Worry: Not on file    Inability: Not on file  . Transportation needs:    Medical: Not on file    Non-medical: Not on file  Tobacco Use  . Smoking status: Current Every Day Smoker    Packs/day: 1.00    Years: 20.00    Pack years: 20.00  . Smokeless tobacco: Never Used  Substance and Sexual Activity  . Alcohol use: Yes    Alcohol/week: 4.0 standard drinks    Types: 4 Cans of beer per week  . Drug use: No  . Sexual activity: Not on file  Lifestyle  . Physical activity:    Days per week: Not on file    Minutes per session: Not on file  . Stress: Not on file  Relationships  . Social connections:    Talks on phone: Not on file    Gets together: Not on file    Attends religious service: Not on file    Active member of club or organization: Not on file    Attends meetings of clubs or organizations: Not on file    Relationship status: Not on file  . Intimate partner violence:    Fear of current or ex partner: Not on file    Emotionally  abused: Not on file    Physically abused: Not on file    Forced sexual activity: Not on file  Other Topics Concern  . 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 Neck:  Supple; no masses or thyromegaly. Lungs:  Clear throughout to auscultation.    Heart:  Regular rate and rhythm. Abdomen:  Soft, nontender and nondistended. Normal bowel sounds, without guarding, and without rebound.   Neurologic:  Alert and  oriented x4;  grossly normal neurologically.  Impression/Plan: Emily Knox is here for an colonoscopy to be performed for rectal bleeding  Risks, benefits, limitations, and  alternatives regarding  colonoscopy have been reviewed with the patient.  Questions have been answered.  All parties agreeable.   Sherri Sear, MD  02/27/2019, 12:50 PM

## 2019-02-27 NOTE — Transfer of Care (Signed)
Immediate Anesthesia Transfer of Care Note  Patient: Emily Knox  Procedure(s) Performed: COLONOSCOPY WITH PROPOFOL (N/A )  Patient Location: PACU and Endoscopy Unit  Anesthesia Type:General  Level of Consciousness: awake, alert  and oriented  Airway & Oxygen Therapy: Patient Spontanous Breathing  Post-op Assessment: Report given to RN and Post -op Vital signs reviewed and stable  Post vital signs: Reviewed and stable  Last Vitals:  Vitals Value Taken Time  BP 98/70 02/27/2019  1:19 PM  Temp 36.1 C 02/27/2019  1:19 PM  Pulse 85 02/27/2019  1:22 PM  Resp 21 02/27/2019  1:22 PM  SpO2 99 % 02/27/2019  1:22 PM  Vitals shown include unvalidated device data.  Last Pain:  Vitals:   02/27/19 1319  TempSrc: Tympanic  PainSc: 0-No pain         Complications: No apparent anesthesia complications

## 2019-02-27 NOTE — Anesthesia Preprocedure Evaluation (Signed)
Anesthesia Evaluation  Patient identified by MRN, date of birth, ID band Patient awake    Reviewed: Allergy & Precautions, H&P , NPO status , Patient's Chart, lab work & pertinent test results  History of Anesthesia Complications Negative for: history of anesthetic complications  Airway Mallampati: II  TM Distance: >3 FB Neck ROM: full    Dental  (+) Chipped, Poor Dentition   Pulmonary neg shortness of breath, Current Smoker,           Cardiovascular Exercise Tolerance: Good + dysrhythmias      Neuro/Psych PSYCHIATRIC DISORDERS  Neuromuscular disease negative psych ROS   GI/Hepatic Neg liver ROS, GERD  Medicated and Controlled,  Endo/Other  Hypothyroidism   Renal/GU negative Renal ROS  negative genitourinary   Musculoskeletal   Abdominal   Peds  Hematology negative hematology ROS (+)   Anesthesia Other Findings Past Medical History: No date: Allergy No date: Anxiety     Comment:  panic attacks No date: Cough     Comment:  due to allergies No date: Dysrhythmia     Comment:  palpatations - due to thyroid No date: GERD (gastroesophageal reflux disease) No date: Hyperlipidemia No date: Hypothyroidism No date: Low back strain No date: Lyme disease     Comment:  2010 No date: Motion sickness     Comment:  all moving vehicles 1991: Ovarian cancer ([place])     Comment:  Left Oophorectomy No date: Seasonal allergies  Past Surgical History: No date: LEFT OOPHORECTOMY 01/31/2015: MASS BIOPSY; N/A     Comment:  Procedure: NECK MASS BIOPSY;  Surgeon: Beverly Gust,               MD;  Location: [place];  Service: ENT;                Laterality: N/A;  BMI    Body Mass Index:  23.05 kg/m      Reproductive/Obstetrics negative OB ROS                             Anesthesia Physical Anesthesia Plan  ASA: III  Anesthesia Plan: General   Post-op Pain Management:     Induction: Intravenous  PONV Risk Score and Plan: Propofol infusion and TIVA  Airway Management Planned: Natural Airway and Nasal Cannula  Additional Equipment:   Intra-op Plan:   Post-operative Plan:   Informed Consent: I have reviewed the patients History and Physical, chart, labs and discussed the procedure including the risks, benefits and alternatives for the proposed anesthesia with the patient or authorized representative who has indicated his/her understanding and acceptance.     Dental Advisory Given  Plan Discussed with: Anesthesiologist, CRNA and Surgeon  Anesthesia Plan Comments: (Patient consented for risks of anesthesia including but not limited to:  - adverse reactions to medications - risk of intubation if required - damage to teeth, lips or other oral mucosa - sore throat or hoarseness - Damage to heart, brain, lungs or loss of life  Patient voiced understanding.)        Anesthesia Quick Evaluation

## 2019-02-28 ENCOUNTER — Encounter: Payer: Self-pay | Admitting: Gastroenterology

## 2019-02-28 NOTE — Anesthesia Postprocedure Evaluation (Signed)
Anesthesia Post Note  Patient: Emily Knox  Procedure(s) Performed: COLONOSCOPY WITH PROPOFOL (N/A )  Patient location during evaluation: PACU Anesthesia Type: General Level of consciousness: awake and alert Pain management: pain level controlled Vital Signs Assessment: post-procedure vital signs reviewed and stable Respiratory status: spontaneous breathing, nonlabored ventilation and respiratory function stable Cardiovascular status: blood pressure returned to baseline and stable Postop Assessment: no apparent nausea or vomiting Anesthetic complications: no     Last Vitals:  Vitals:   02/27/19 1329 02/27/19 1349  BP: 104/74 103/79  Pulse: 75 72  Resp: 17 18  Temp:    SpO2: 99% 100%    Last Pain:  Vitals:   02/28/19 0729  TempSrc:   PainSc: 0-No pain                 Durenda Hurt

## 2019-03-01 LAB — SURGICAL PATHOLOGY

## 2019-03-02 ENCOUNTER — Encounter: Payer: Self-pay | Admitting: Gastroenterology

## 2019-03-14 ENCOUNTER — Ambulatory Visit (INDEPENDENT_AMBULATORY_CARE_PROVIDER_SITE_OTHER): Payer: BC Managed Care – PPO | Admitting: Gastroenterology

## 2019-03-14 ENCOUNTER — Telehealth: Payer: Self-pay | Admitting: Gastroenterology

## 2019-03-14 DIAGNOSIS — K625 Hemorrhage of anus and rectum: Secondary | ICD-10-CM

## 2019-03-14 DIAGNOSIS — K644 Residual hemorrhoidal skin tags: Secondary | ICD-10-CM | POA: Diagnosis not present

## 2019-03-14 NOTE — Telephone Encounter (Signed)
I CALLED PATIENT TO MAKE AN APPT FOR 2-3 WK BANDAGING (CAN OFFER 04-13-19 IN [facility])

## 2019-03-14 NOTE — Progress Notes (Signed)
Sherri Sear, MD [address]  [place]  [place], [place] [address]  Main: [phone]  Fax: [phone]    Gastroenterology Consultation Video Visit  Referring Provider:     Burnard Hawthorne, FNP Primary Care Physician:  Burnard Hawthorne, FNP Primary Gastroenterologist:  Dr. Cephas Darby Reason for Consultation:     Rectal bleeding        HPI:   Emily Knox is a 48 y.o. female referred by Dr. Burnard Hawthorne, [place]  for consultation & management of rectal bleeding  Virtual Visit Video Note  I connected with Emily Knox on 03/14/19 at  8:30 AM EDT by video and verified that I am speaking with the correct person using two identifiers.   I discussed the limitations, risks, security and privacy concerns of performing an evaluation and management service by video and the availability of in person appointments. I also discussed with the patient that there may be a patient responsible charge related to this service. The patient expressed understanding and agreed to proceed.  Location of the Patient: Outside  Location of the provider: Home office  Persons participating in the visit: Patient and provider only   History of Present Illness: Emily Knox initially saw me in 11/2018 due to intermittent episodes of rectal bleeding.  She subsequently underwent colonoscopy which revealed small polyps that were tubular adenomas as well as large hemorrhoids.  Her bleeding is secondary to hemorrhoids.  Since colonoscopy, she did not have further episodes.  However, she is interested in hemorrhoid ligation as her hemorrhoids are large with her prior bleeding episodes she would like to undergo ligation before waiting for too long    NSAIDs: None  Antiplts/Anticoagulants/Anti thrombotics: None  GI Procedures: Colonoscopy 02/2019 Hemorrhoids were found on perianal exam. The terminal ileum appeared normal. Three sessile polyps were found in the descending colon. The  polyps were 5 mm in size. These polyps were removed with a cold snare. Resection and retrieval were complete. Non-bleeding external hemorrhoids were found during retroflexion. The hemorrhoids were large. DIAGNOSIS:  A. COLON POLYP X 3, DESCENDING; COLD SNARE:  - TUBULAR ADENOMAS, 3 FRAGMENTS.  - NEGATIVE FOR HIGH-GRADE DYSPLASIA AND MALIGNANCY.  Past Medical History:  Diagnosis Date  . Allergy   . Anxiety    panic attacks  . Cough    due to allergies  . Dysrhythmia    palpatations - due to thyroid  . GERD (gastroesophageal reflux disease)   . Hyperlipidemia   . Hypothyroidism   . Low back strain   . Lyme disease    2010  . Motion sickness    all moving vehicles  . Ovarian cancer ([place]) 1991   Left Oophorectomy  . Seasonal allergies     Past Surgical History:  Procedure Laterality Date  . COLONOSCOPY WITH PROPOFOL N/A 02/27/2019   Procedure: COLONOSCOPY WITH PROPOFOL;  Surgeon: Lin Landsman, MD;  Location: [facility] ENDOSCOPY;  Service: Gastroenterology;  Laterality: N/A;  . LEFT OOPHORECTOMY    . MASS BIOPSY N/A 01/31/2015   Procedure: NECK MASS BIOPSY;  Surgeon: Beverly Gust, MD;  Location: [facility];  Service: ENT;  Laterality: N/A;    Current Outpatient Medications:  .  buPROPion (WELLBUTRIN XL) 150 MG 24 hr tablet, take 1 tablet by mouth once daily, Disp: , Rfl:  .  SYNTHROID 100 MCG tablet, , Disp: , Rfl: 3   Family History  Problem Relation Age of Onset  . COPD Father   .  Colon cancer Paternal Grandmother 37  . Ovarian cancer Neg Hx      Social History   Tobacco Use  . Smoking status: Current Every Day Smoker    Packs/day: 1.00    Years: 20.00    Pack years: 20.00  . Smokeless tobacco: Never Used  Substance Use Topics  . Alcohol use: Yes    Alcohol/week: 4.0 standard drinks    Types: 4 Cans of beer per week  . Drug use: No    Allergies as of 03/14/2019 - Review Complete 03/14/2019  Allergen Reaction Noted  . Onion Hives 01/27/2015   . Peanuts [peanut oil] Hives 01/27/2015  . Tomato Hives 01/27/2015     Imaging Studies: Reviewed  Assessment and Plan:   Emily Knox is a 48 y.o. female with intermittent rectal bleeding, painless secondary to hemorrhoids.  I discussed with her about risks and benefits of patient hemorrhoid ligation patient wishes to proceed with it.  Will bring her back to office in next 2 to 3 weeks for banding   Follow Up Instructions:   I discussed the assessment and treatment plan with the patient. The patient was provided an opportunity to ask questions and all were answered. The patient agreed with the plan and demonstrated an understanding of the instructions.   The patient was advised to call back or seek an in-person evaluation if the symptoms worsen or if the condition fails to improve as anticipated.  I provided 10 minutes of face-to-face time during this encounter.   Follow up in 2 to 3 weeks   Cephas Darby, MD

## 2019-08-31 ENCOUNTER — Other Ambulatory Visit: Payer: Self-pay

## 2019-08-31 DIAGNOSIS — Z20822 Contact with and (suspected) exposure to covid-19: Secondary | ICD-10-CM

## 2019-09-02 ENCOUNTER — Telehealth: Payer: Self-pay

## 2019-09-02 LAB — NOVEL CORONAVIRUS, NAA: SARS-CoV-2, NAA: NOT DETECTED

## 2019-09-02 NOTE — Telephone Encounter (Signed)
Pt  called for covid results -advised results are not back.  

## 2019-10-09 DIAGNOSIS — E039 Hypothyroidism, unspecified: Secondary | ICD-10-CM | POA: Diagnosis not present

## 2019-10-16 DIAGNOSIS — F419 Anxiety disorder, unspecified: Secondary | ICD-10-CM | POA: Diagnosis not present

## 2019-10-16 DIAGNOSIS — E039 Hypothyroidism, unspecified: Secondary | ICD-10-CM | POA: Diagnosis not present

## 2019-10-16 DIAGNOSIS — M109 Gout, unspecified: Secondary | ICD-10-CM | POA: Diagnosis not present

## 2019-10-16 DIAGNOSIS — G5603 Carpal tunnel syndrome, bilateral upper limbs: Secondary | ICD-10-CM | POA: Diagnosis not present

## 2020-01-29 DIAGNOSIS — J069 Acute upper respiratory infection, unspecified: Secondary | ICD-10-CM | POA: Diagnosis not present

## 2020-01-30 DIAGNOSIS — Z03818 Encounter for observation for suspected exposure to other biological agents ruled out: Secondary | ICD-10-CM | POA: Diagnosis not present

## 2020-01-30 DIAGNOSIS — Z20828 Contact with and (suspected) exposure to other viral communicable diseases: Secondary | ICD-10-CM | POA: Diagnosis not present

## 2020-04-07 IMAGING — CT CT ABD-PELV W/ CM
2 of 5 series · 16 of 46 positions shown, 18 images · IV contrast (iopamidol)
Comparison: None.

CLINICAL DATA: Abdominal pain and distention for 2 months. Personal
history of left ovarian carcinoma.

EXAM:
CT ABDOMEN AND PELVIS WITH CONTRAST
TECHNIQUE: Multidetector CT imaging of the abdomen and pelvis was performed
using the standard protocol following bolus administration of
intravenous contrast.
CONTRAST:  75mL 57CQKF-[id number] IOPAMIDOL ([id number]) INJECTION 61%

[Series 2: abd pelvis · axial · 0.64mm/px · z∈[-1469,-1109]mm · 13 of 82 slices shown, 15 images (1 of 2)]
[im 5/82  soft-tissue]
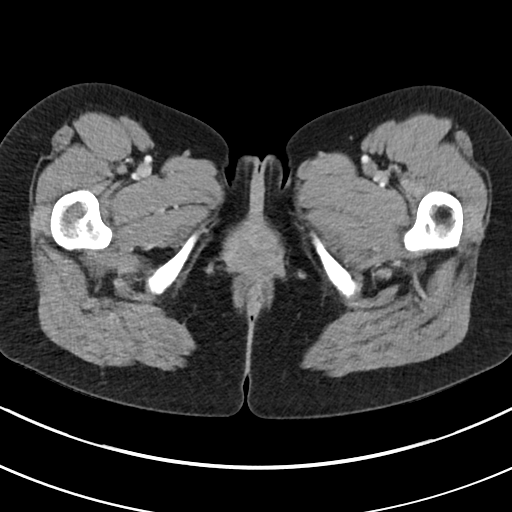
[im 5/82  bone]
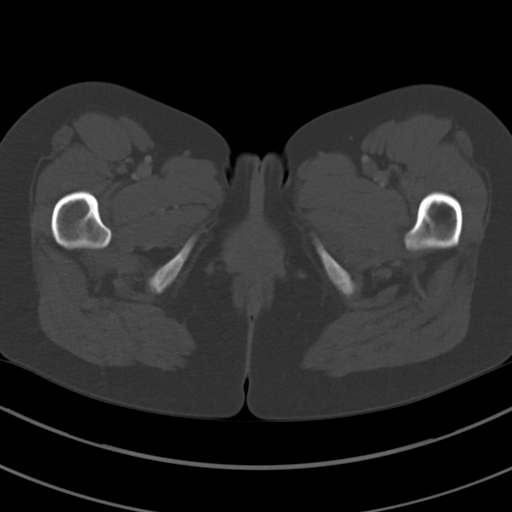
[im 13/82  soft-tissue]
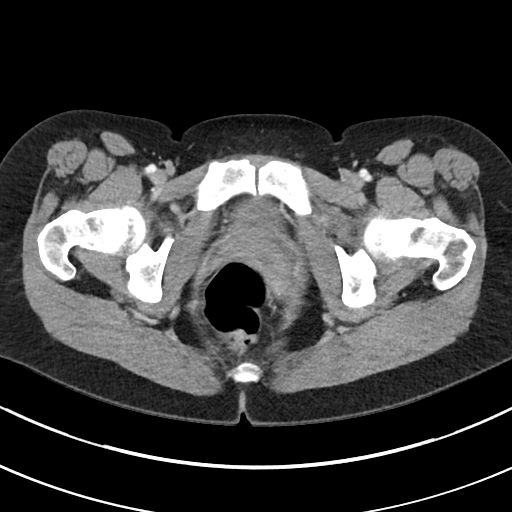
[im 17/82  soft-tissue]
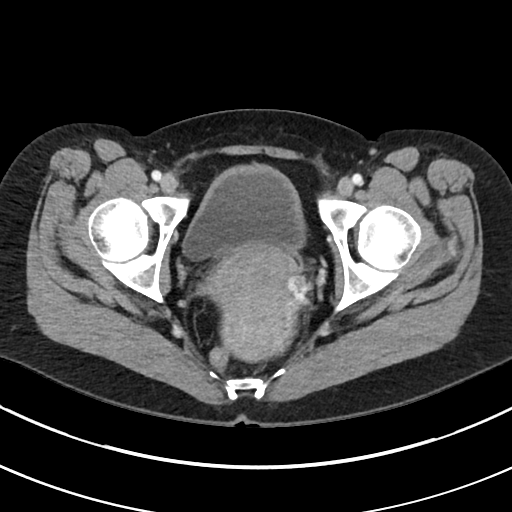
[im 25/82  soft-tissue]
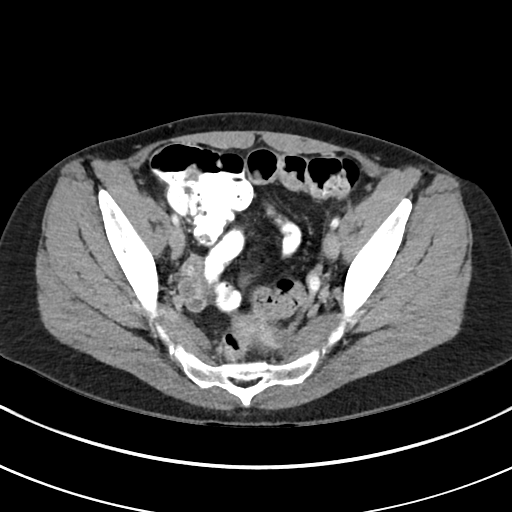
[im 29/82  soft-tissue]
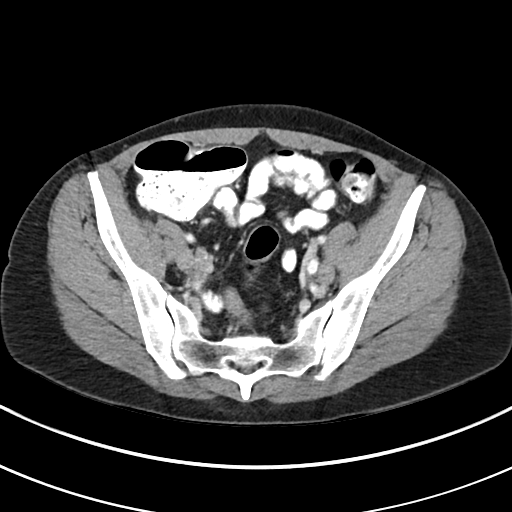
[im 37/82  soft-tissue]
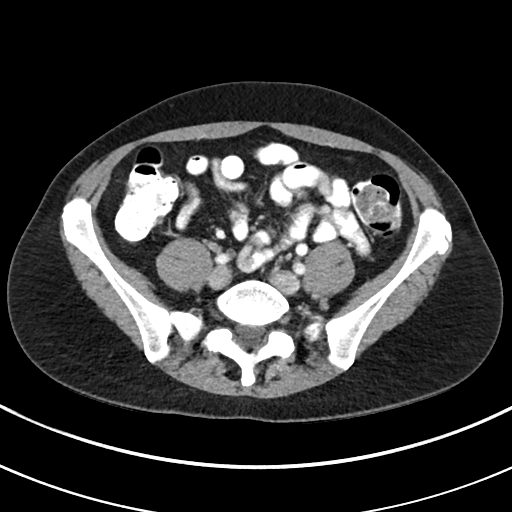
[im 41/82  soft-tissue]
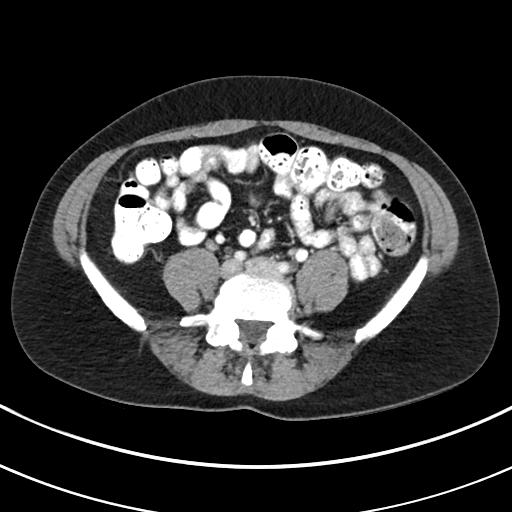
[im 45/82  soft-tissue]
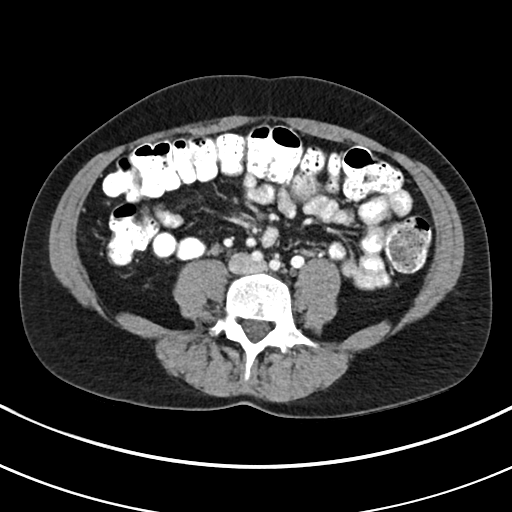
[im 53/82  soft-tissue]
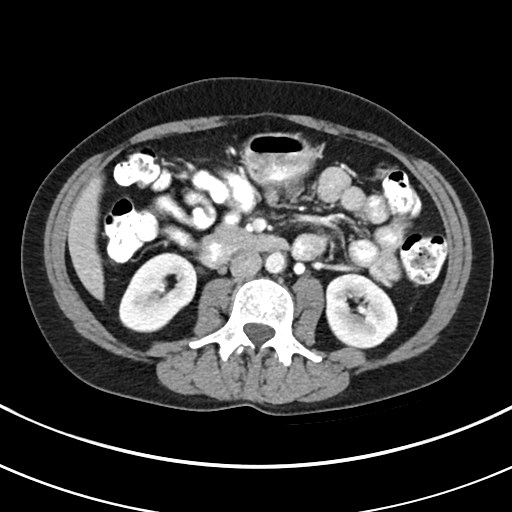
[im 53/82  bone]
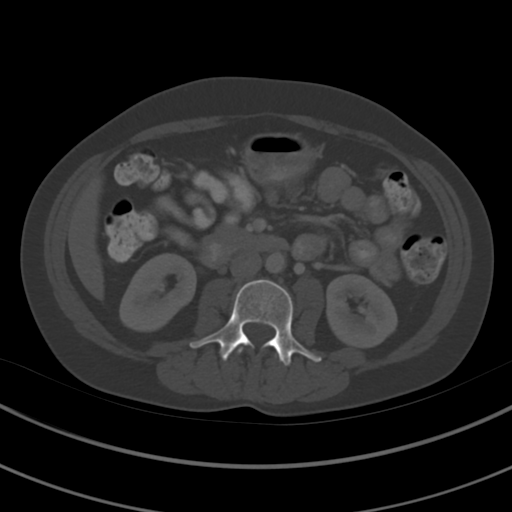
[im 57/82  soft-tissue]
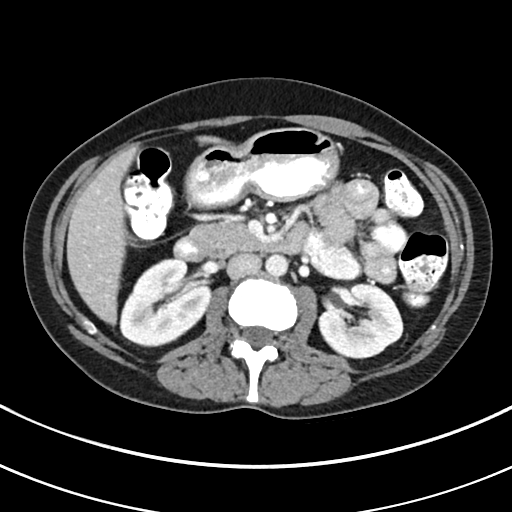
[im 65/82  soft-tissue]
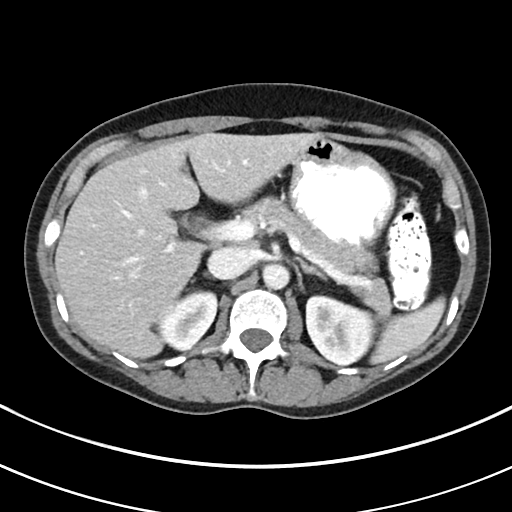
[im 69/82  soft-tissue]
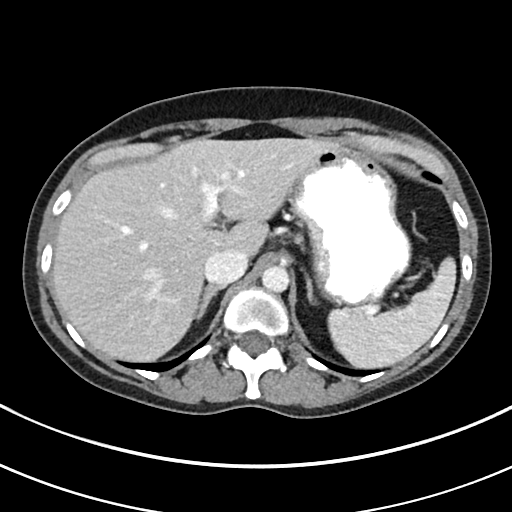
[im 77/82  soft-tissue]
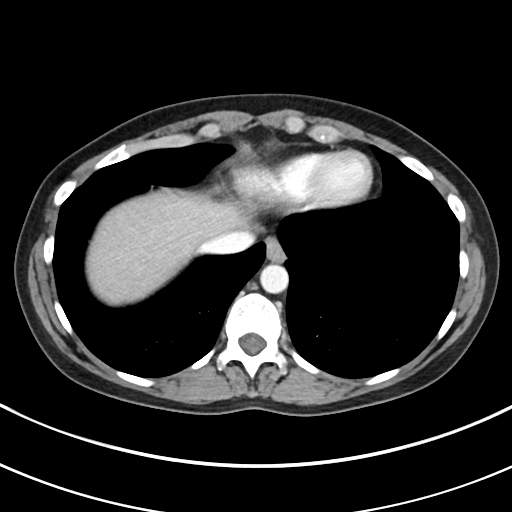

[Series 4: abd pelvis · coronal · 0.64mm/px · 3 of 118 slices shown (2 of 2)]
[im 40/118  soft-tissue]
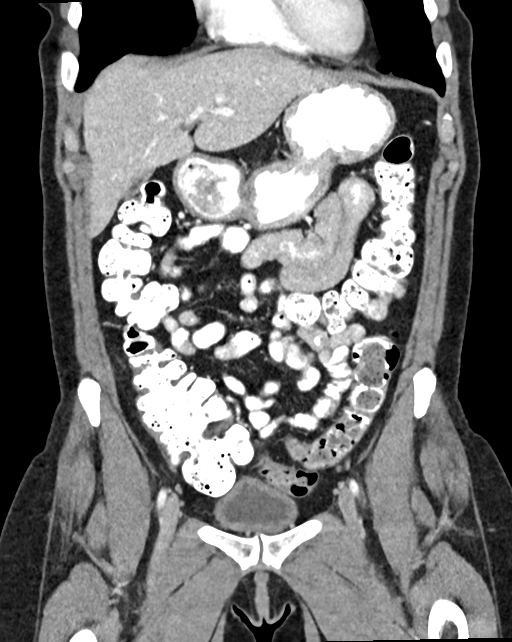
[im 53/118  soft-tissue]
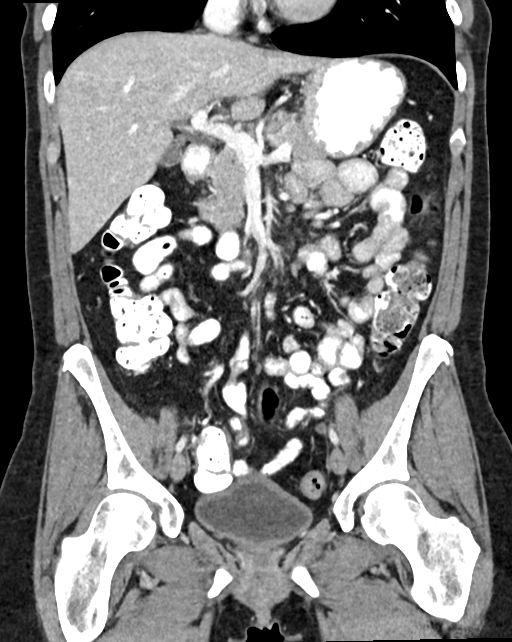
[im 66/118  soft-tissue]
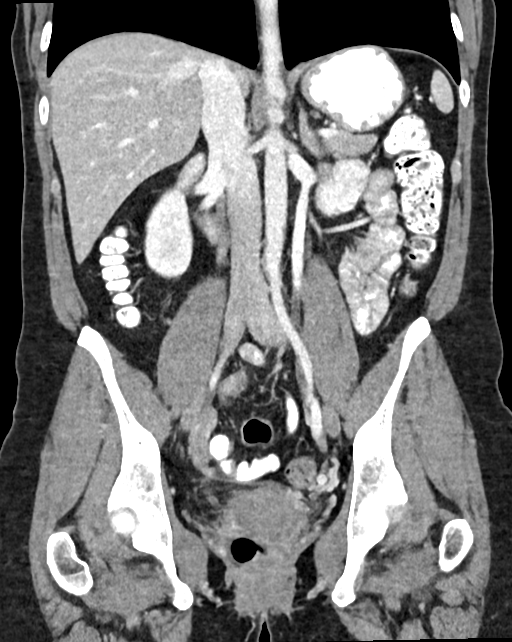

[16 of 46 positions shown; findings below may reference images not displayed]

FINDINGS: Lower Chest: No acute findings.

Hepatobiliary: No hepatic masses identified. Gallbladder is
unremarkable.

Pancreas:  No mass or inflammatory changes.

Spleen: Within normal limits in size and appearance.

Adrenals/Urinary Tract: No masses identified. No evidence of
hydronephrosis. Unremarkable unopacified urinary bladder.

Stomach/Bowel: No evidence of obstruction, inflammatory process or
abnormal fluid collections. Normal appendix visualized.

Vascular/Lymphatic: No pathologically enlarged lymph nodes. No
abdominal aortic aneurysm.

Reproductive:  No mass or other significant abnormality.

Other:  No evidence of peritoneal thickening or ascites.

Musculoskeletal:  No suspicious bone lesions identified.
IMPRESSION: Negative. No evidence of recurrent or metastatic carcinoma. No other
significant abnormality.

## 2020-05-03 DIAGNOSIS — T7840XA Allergy, unspecified, initial encounter: Secondary | ICD-10-CM | POA: Diagnosis not present

## 2020-05-03 DIAGNOSIS — X58XXXA Exposure to other specified factors, initial encounter: Secondary | ICD-10-CM | POA: Diagnosis not present

## 2020-05-03 DIAGNOSIS — R21 Rash and other nonspecific skin eruption: Secondary | ICD-10-CM | POA: Diagnosis not present

## 2020-08-07 ENCOUNTER — Encounter: Payer: Self-pay | Admitting: Nurse Practitioner

## 2020-08-07 ENCOUNTER — Other Ambulatory Visit: Payer: Self-pay

## 2020-08-07 ENCOUNTER — Telehealth (INDEPENDENT_AMBULATORY_CARE_PROVIDER_SITE_OTHER): Payer: Self-pay | Admitting: Nurse Practitioner

## 2020-08-07 VITALS — Ht 60.0 in | Wt 118.0 lb

## 2020-08-07 DIAGNOSIS — J0100 Acute maxillary sinusitis, unspecified: Secondary | ICD-10-CM

## 2020-08-07 DIAGNOSIS — F17200 Nicotine dependence, unspecified, uncomplicated: Secondary | ICD-10-CM

## 2020-08-07 DIAGNOSIS — J069 Acute upper respiratory infection, unspecified: Secondary | ICD-10-CM | POA: Insufficient documentation

## 2020-08-07 MED ORDER — TRIAMCINOLONE ACETONIDE 55 MCG/ACT NA AERO: 2.0000 | INHALATION_SPRAY | Freq: Every day | NASAL | 12 refills | Status: AC

## 2020-08-07 MED ORDER — AMOXICILLIN-POT CLAVULANATE 875-125 MG PO TABS: 1.0000 | ORAL_TABLET | Freq: Two times a day (BID) | ORAL | 0 refills | Status: DC

## 2020-08-07 MED ORDER — GUAIFENESIN ER 600 MG PO TB12: 1200.0000 mg | ORAL_TABLET | Freq: Two times a day (BID) | ORAL | 0 refills | Status: AC | PRN

## 2020-08-07 MED ORDER — SACCHAROMYCES BOULARDII 250 MG PO CAPS: 250.0000 mg | ORAL_CAPSULE | Freq: Two times a day (BID) | ORAL | 0 refills | Status: AC

## 2020-08-07 NOTE — Patient Instructions (Addendum)
To the backdoor for nasal swabs for Covid and RSV today 2:30.  For 6-week history of nasal congestion, maxillary pressure, cough, postnasal drainage, begin Augmentin 875 mg 1 pill twice daily for 7 days.  Begin probiotic Florastor at 1 pill twice daily for 2 weeks to help prevent diarrhea from antibiotic use.  If you like yogurt, eat yogurt with live cultures.  Mucinex 1200 mg 1 pill twice daily-to help thin secretions so that she can blow it out.  Nasacort 2 sprays each nostril 1 time daily  Continue with Nettie pot as needed. If no improvement in the next 3 days- seek in person evaluation.   Contact a health care provider if:  You have a fever.  Your symptoms get worse.  Your symptoms do not improve within 10 days. Get help right away if:  You have a severe headache.  You have persistent vomiting.  You have severe pain or swelling around your face or eyes.  You have vision problems.  You develop confusion.  Your neck is stiff.  You have trouble breathing.   Smoking cessation discussed, [place] quit line number provided.  I think she will do well with nicotine replacement patches and gum along with behavioral therapy.   -Provided information on 1 800-QUIT NOW support program.  720-391-8554 which provides 24 hours 7 days a week counseling, often free samples of nicotine patches and gum and instructions how to use these.    We also recommend meeting with a behavioral therapist to help with the stress and habit of not smoking. If you would like a referral, we can do this.   Please follow up with Emily Knox  in 1 month .     Sinusitis, Adult Sinusitis is inflammation of your sinuses. Sinuses are hollow spaces in the bones around your face. Your sinuses are located:  Around your eyes.  In the middle of your forehead.  Behind your nose.  In your cheekbones. Mucus normally drains out of your sinuses. When your nasal tissues become inflamed or swollen, mucus can become  trapped or blocked. This allows bacteria, viruses, and fungi to grow, which leads to infection. Most infections of the sinuses are caused by a virus. Sinusitis can develop quickly. It can last for up to 4 weeks (acute) or for more than 12 weeks (chronic). Sinusitis often develops after a cold. What are the causes? This condition is caused by anything that creates swelling in the sinuses or stops mucus from draining. This includes:  Allergies.  Asthma.  Infection from bacteria or viruses.  Deformities or blockages in your nose or sinuses.  Abnormal growths in the nose (nasal polyps).  Pollutants, such as chemicals or irritants in the air.  Infection from fungi (rare). What increases the risk? You are more likely to develop this condition if you:  Have a weak body defense system (immune system).  Do a lot of swimming or diving.  Overuse nasal sprays.  Smoke. What are the signs or symptoms? The main symptoms of this condition are pain and a feeling of pressure around the affected sinuses. Other symptoms include:  Stuffy nose or congestion.  Thick drainage from your nose.  Swelling and warmth over the affected sinuses.  Headache.  Upper toothache.  A cough that may get worse at night.  Extra mucus that collects in the throat or the back of the nose (postnasal drip).  Decreased sense of smell and taste.  Fatigue.  A fever.  Sore throat.  Bad breath. How  is this diagnosed? This condition is diagnosed based on:  Your symptoms.  Your medical history.  A physical exam.  Tests to find out if your condition is acute or chronic. This may include: ? Checking your nose for nasal polyps. ? Viewing your sinuses using a device that has a light (endoscope). ? Testing for allergies or bacteria. ? Imaging tests, such as an MRI or CT scan. In rare cases, a bone biopsy may be done to rule out more serious types of fungal sinus disease. How is this treated? Treatment  for sinusitis depends on the cause and whether your condition is chronic or acute.  If caused by a virus, your symptoms should go away on their own within 10 days. You may be given medicines to relieve symptoms. They include: ? Medicines that shrink swollen nasal passages (topical intranasal decongestants). ? Medicines that treat allergies (antihistamines). ? A spray that eases inflammation of the nostrils (topical intranasal corticosteroids). ? Rinses that help get rid of thick mucus in your nose (nasal saline washes).  If caused by bacteria, your health care provider may recommend waiting to see if your symptoms improve. Most bacterial infections will get better without antibiotic medicine. You may be given antibiotics if you have: ? A severe infection. ? A weak immune system.  If caused by narrow nasal passages or nasal polyps, you may need to have surgery. Follow these instructions at home: Medicines  Take, use, or apply over-the-counter and prescription medicines only as told by your health care provider. These may include nasal sprays.  If you were prescribed an antibiotic medicine, take it as told by your health care provider. Do not stop taking the antibiotic even if you start to feel better. Hydrate and humidify   Drink enough fluid to keep your urine pale yellow. Staying hydrated will help to thin your mucus.  Use a cool mist humidifier to keep the humidity level in your home above 50%.  Inhale steam for 10-15 minutes, 3-4 times a day, or as told by your health care provider. You can do this in the bathroom while a hot shower is running.  Limit your exposure to cool or dry air. Rest  Rest as much as possible.  Sleep with your head raised (elevated).  Make sure you get enough sleep each night. General instructions   Apply a warm, moist washcloth to your face 3-4 times a day or as told by your health care provider. This will help with discomfort.  Wash your hands  often with soap and water to reduce your exposure to germs. If soap and water are not available, use hand sanitizer.  Do not smoke. Avoid being around people who are smoking (secondhand smoke).  Keep all follow-up visits as told by your health care provider. This is important. Contact a health care provider if:  You have a fever.  Your symptoms get worse.  Your symptoms do not improve within 10 days. Get help right away if:  You have a severe headache.  You have persistent vomiting.  You have severe pain or swelling around your face or eyes.  You have vision problems.  You develop confusion.  Your neck is stiff.  You have trouble breathing. Summary  Sinusitis is soreness and inflammation of your sinuses. Sinuses are hollow spaces in the bones around your face.  This condition is caused by nasal tissues that become inflamed or swollen. The swelling traps or blocks the flow of mucus. This allows bacteria, viruses, and  fungi to grow, which leads to infection.  If you were prescribed an antibiotic medicine, take it as told by your health care provider. Do not stop taking the antibiotic even if you start to feel better.  Keep all follow-up visits as told by your health care provider. This is important. This information is not intended to replace advice given to you by your health care provider. Make sure you discuss any questions you have with your health care provider. Document Revised: 02/06/2018 Document Reviewed: 02/06/2018 Elsevier Patient Education  [place].  Tobacco Use Disorder Tobacco use disorder (TUD) occurs when a person craves, seeks, and uses tobacco, regardless of the consequences. This disorder can cause problems with mental and physical health. It can affect your ability to have healthy relationships, and it can keep you from meeting your responsibilities at work, home, or school. Tobacco may be:  Smoked as a cigarette or cigar.  Inhaled using  e-cigarettes.  Smoked in a pipe or hookah.  Chewed as smokeless tobacco.  Inhaled into the nostrils as snuff. Tobacco products contain a dangerous chemical called nicotine, which is very addictive. Nicotine triggers hormones that make the body feel stimulated and works on areas of the brain that make you feel good. These effects can make it hard for people to quit nicotine. Tobacco contains many other unsafe chemicals that can damage almost every organ in the body. Smoking tobacco also puts others in danger due to fire risk and possible health problems caused by breathing in secondhand smoke. What are the signs or symptoms? Symptoms of TUD may include:  Being unable to slow down or stop your tobacco use.  Spending an abnormal amount of time getting or using tobacco.  Craving tobacco. Cravings may last for up to 6 months after quitting.  Tobacco use that: ? Interferes with your work, school, or home life. ? Interferes with your personal and social relationships. ? Makes you give up activities that you once enjoyed or found important.  Using tobacco even though you know that it is: ? Dangerous or bad for your health or someone else's health. ? Causing problems in your life.  Needing more and more of the substance to get the same effect (developing tolerance).  Experiencing unpleasant symptoms if you do not use the substance (withdrawal). Withdrawal symptoms may include: ? Depressed, anxious, or irritable mood. ? Difficulty concentrating. ? Increased appetite. ? Restlessness or trouble sleeping.  Using the substance to avoid withdrawal. How is this diagnosed? This condition may be diagnosed based on:  Your current and past tobacco use. Your health care provider may ask questions about how your tobacco use affects your life.  A physical exam. You may be diagnosed with TUD if you have at least two symptoms within a 76-month period. How is this treated? This condition is treated  by stopping tobacco use. Many people are unable to quit on their own and need help. Treatment may include:  Nicotine replacement therapy (NRT). NRT provides nicotine without the other harmful chemicals in tobacco. NRT gradually lowers the dosage of nicotine in the body and reduces withdrawal symptoms. NRT is available as: ? Over-the-counter gums, lozenges, and skin patches. ? Prescription mouth inhalers and nasal sprays.  Medicine that acts on the brain to reduce cravings and withdrawal symptoms.  A type of talk therapy that examines your triggers for tobacco use, how to avoid them, and how to cope with cravings (behavioral therapy).  Hypnosis. This may help with withdrawal symptoms.  Joining a support group for others coping with TUD. The best treatment for TUD is usually a combination of medicine, talk therapy, and support groups. Recovery can be a long process. Many people start using tobacco again after stopping (relapse). If you relapse, it does not mean that treatment will not work. Follow these instructions at home:  Lifestyle  Do not use any products that contain nicotine or tobacco, such as cigarettes and e-cigarettes.  Avoid things that trigger tobacco use as much as you can. Triggers include people and situations that usually cause you to use tobacco.  Avoid drinks that contain caffeine, including coffee. These may worsen some withdrawal symptoms.  Find ways to manage stress. Wanting to smoke may cause stress, and stress can make you want to smoke. Relaxation techniques such as deep breathing, meditation, and yoga may help.  Attend support groups as needed. These groups are an important part of long-term recovery for many people. General instructions  Take over-the-counter and prescription medicines only as told by your health care provider.  Check with your health care provider before taking any new prescription or over-the-counter medicines.  Decide on a friend, family  member, or smoking quit-line (such as 1-800-QUIT-NOW in the [place] that you can call or text when you feel the urge to smoke or when you need help coping with cravings.  Keep all follow-up visits as told by your health care provider and therapist. This is important. Contact a health care provider if:  You are not able to take your medicines as prescribed.  Your symptoms get worse, even with treatment. Summary  Tobacco use disorder (TUD) occurs when a person craves, seeks, and uses tobacco regardless of the consequences.  This condition may be diagnosed based on your current and past tobacco use and a physical exam.  Many people are unable to quit on their own and need help. Recovery can be a long process.  The most effective treatment for TUD is usually a combination of medicine, talk therapy, and support groups. This information is not intended to replace advice given to you by your health care provider. Make sure you discuss any questions you have with your health care provider. Document Revised: 08/24/2017 Document Reviewed: 08/24/2017 Elsevier Patient Education  2020 [employer].

## 2020-08-07 NOTE — Progress Notes (Signed)
Virtual Visit via converted to telephone  This visit type was conducted due to national recommendations for restrictions regarding the COVID-19 pandemic (e.g. social distancing).  This format is felt to be most appropriate for this patient at this time.  All issues noted in this document were discussed and addressed.  No physical exam was performed (except for noted visual exam findings with Video Visits).   I connected with@ on 08/09/20 at  1:00 PM EST by a video enabled telemedicine application or telephone and verified that I am speaking with the correct person using two identifiers. Location patient: home Location provider: work or home office Persons participating in the virtual visit: patient, provider  I discussed the limitations, risks, security and privacy concerns of performing an evaluation and management service by telephone and the availability of in person appointments. I also discussed with the patient that there may be a patient responsible charge related to this service. The patient expressed understanding and agreed to proceed.  Interactive audio and video telecommunications were attempted between this provider and patient, however failed, due to patient having technical difficulties . We continued and completed visit with audio only.   Reason for visit: Sinus infection with pressure congestion x6 weeks.  HPI: This 49 year old with history of hypothyroidism, GAD, tobacco use disorder reports 6-week history of sinus congestion, initially thought it was allergies from mowing the yard.   Her stuffy head just has remained, and now she has developed a raspy cough, significant sinus pressure under her eyes, and pressure, not able to blow out any nasal discharge but she is coughing it up.  She has been taking allergy medications Claritin-D, Flonase, and has used Mucinex and Robitussin without resolution.  She has an Child psychotherapist pot with normal saline that she has been using that  works well to clean out her nose but it has not resolved her current symptoms.  She feels like there is infection deep in her head.  She has had no fevers or chills, sore throat, chest pain or shortness of breath, nocturnal awakening, wheezing or tightness.  She did have Covid virus last March 2020 with symptoms of cough at night, sinus infection for 1 week.  She has not had a recent Covid test.  She declines  Covid vaccines.  No exposure to any ill contacts.  ROS: See pertinent positives and negatives per HPI.  Past Medical History:  Diagnosis Date  . Allergy   . Anxiety    panic attacks  . Cough    due to allergies  . Dysrhythmia    palpatations - due to thyroid  . GERD (gastroesophageal reflux disease)   . Hyperlipidemia   . Hypothyroidism   . Low back strain   . Lyme disease    2010  . Motion sickness    all moving vehicles  . Ovarian cancer ([place]) 1991   Left Oophorectomy  . Seasonal allergies     Past Surgical History:  Procedure Laterality Date  . COLONOSCOPY WITH PROPOFOL N/A 02/27/2019   Procedure: COLONOSCOPY WITH PROPOFOL;  Surgeon: Lin Landsman, MD;  Location: [facility] END[facility]COPY;  Service: Gastroenterology;  Laterality: N/A;  . LEFT OOPHORECTOMY    . MASS BIOPSY N/A 01/31/2015   Procedure: NECK MASS BIOPSY;  Surgeon: Beverly Gust, MD;  Location: [place];  Service: ENT;  Laterality: N/A;    Family History  Problem Relation Age of Onset  . COPD Father   . Colon cancer Paternal Grandmother 71  . Ovarian cancer  Neg Hx     SOCIAL HX: She does smoke 1/2 pack/day, previously tried Wellbutrin and Chantix without result.  She would like to quit smoking.  She has not tried nicotine products   Current Outpatient Medications:  .  SYNTHROID 100 MCG tablet, , Disp: , Rfl: 3 .  amoxicillin-clavulanate (AUGMENTIN) 875-125 MG tablet, Take 1 tablet by mouth 2 (two) times daily., Disp: 14 tablet, Rfl: 0 .  guaiFENesin (MUCINEX) 600 MG 12 hr tablet, Take 2  tablets (1,200 mg total) by mouth 2 (two) times daily as needed for up to 7 days for cough or to loosen phlegm., Disp: 28 tablet, Rfl: 0 .  saccharomyces boulardii (FLORASTOR) 250 MG capsule, Take 1 capsule (250 mg total) by mouth 2 (two) times daily for 14 days., Disp: 28 capsule, Rfl: 0 .  triamcinolone (NASACORT) 55 MCG/ACT AERO nasal inhaler, Place 2 sprays into the nose daily., Disp: 1 each, Rfl: 12  EXAM:  VITALS per patient if applicable: Weight 627 pounds  GENERAL: She sounds alert, oriented, and in no acute distress  LUNGS: No sounds of respiratory distress, breathing rate appears normal, no obvious gross SOB, gasping or wheezing. Positive frequent dry cough noted  PSYCH/NEURO: pleasant and cooperative, no obvious depression or anxiety, speech and thought processing grossly intact  ASSESSMENT AND PLAN:  Discussed the following assessment and plan:  Subacute maxillary sinusitis - Plan: COVID-19, Flu A+B and RSV, COVID-19, Flu A+B and RSV, CANCELED: Novel Coronavirus, NAA ([facility]), CANCELED: Respiratory virus panel  URI with cough and congestion - Plan: COVID-19, Flu A+B and RSV, COVID-19, Flu A+B and RSV, CANCELED: Novel Coronavirus, NAA ([facility]), CANCELED: Respiratory virus panel  Tobacco use disorder  No problem-specific Assessment & Plan notes found for this encounter.   Pt advised:  Please go to the backdoor for nasal swabs for Covid and RSV today 2:30.  For 6-week history of nasal congestion, maxillary pressure, cough, postnasal drainage, begin Augmentin 875 mg 1 pill twice daily for 7 days.  Begin probiotic Florastor at 1 pill twice daily for 2 weeks to help prevent diarrhea from antibiotic use.  If you like yogurt, eat yogurt with live cultures.  Mucinex 1200 mg 1 pill twice daily-to help thin secretions so that she can blow it out.  Nasacort 2 sprays each nostril 1 time daily  Continue with Nettie pot as needed. If no improvement in the next 3 days- seek in  person evaluation.   Contact a health care provider if:  You have a fever.  Your symptoms get worse.  Your symptoms do not improve within 10 days. Get help right away if:  You have a severe headache.  You have persistent vomiting.  You have severe pain or swelling around your face or eyes.  You have vision problems.  You develop confusion.  Your neck is stiff.  You have trouble breathing.   I discussed the assessment and treatment plan with the patient. The patient was provided an opportunity to ask questions and all were answered. The patient agreed with the plan and demonstrated an understanding of the instructions.   The patient was advised to call back or seek an in-person evaluation if the symptoms worsen or if the condition fails to improve as anticipated.  I provided 14 minutes of telephone time after failed video.  Denice Paradise, NP Adult Nurse Practitioner [place] [phone]

## 2020-08-09 ENCOUNTER — Encounter: Payer: Self-pay | Admitting: Nurse Practitioner

## 2020-08-09 LAB — COVID-19, FLU A+B AND RSV
Influenza A, NAA: NOT DETECTED
Influenza B, NAA: NOT DETECTED
RSV, NAA: NOT DETECTED
SARS-CoV-2, NAA: NOT DETECTED

## 2020-10-13 DIAGNOSIS — E039 Hypothyroidism, unspecified: Secondary | ICD-10-CM | POA: Diagnosis not present

## 2020-10-14 DIAGNOSIS — E039 Hypothyroidism, unspecified: Secondary | ICD-10-CM | POA: Diagnosis not present

## 2020-10-14 DIAGNOSIS — F419 Anxiety disorder, unspecified: Secondary | ICD-10-CM | POA: Diagnosis not present

## 2020-10-14 DIAGNOSIS — R5383 Other fatigue: Secondary | ICD-10-CM | POA: Diagnosis not present

## 2020-10-14 DIAGNOSIS — Z139 Encounter for screening, unspecified: Secondary | ICD-10-CM | POA: Diagnosis not present

## 2020-10-17 DIAGNOSIS — R5383 Other fatigue: Secondary | ICD-10-CM | POA: Diagnosis not present

## 2020-10-17 DIAGNOSIS — Z03818 Encounter for observation for suspected exposure to other biological agents ruled out: Secondary | ICD-10-CM | POA: Diagnosis not present

## 2020-10-17 DIAGNOSIS — Z139 Encounter for screening, unspecified: Secondary | ICD-10-CM | POA: Diagnosis not present

## 2021-06-03 DIAGNOSIS — E039 Hypothyroidism, unspecified: Secondary | ICD-10-CM | POA: Diagnosis not present

## 2021-10-07 DIAGNOSIS — E119 Type 2 diabetes mellitus without complications: Secondary | ICD-10-CM | POA: Diagnosis not present

## 2021-10-07 DIAGNOSIS — E039 Hypothyroidism, unspecified: Secondary | ICD-10-CM | POA: Diagnosis not present

## 2021-10-14 DIAGNOSIS — E039 Hypothyroidism, unspecified: Secondary | ICD-10-CM | POA: Diagnosis not present

## 2022-07-26 DIAGNOSIS — Z720 Tobacco use: Secondary | ICD-10-CM | POA: Diagnosis not present

## 2022-10-15 DIAGNOSIS — E039 Hypothyroidism, unspecified: Secondary | ICD-10-CM | POA: Diagnosis not present

## 2022-10-21 DIAGNOSIS — E039 Hypothyroidism, unspecified: Secondary | ICD-10-CM | POA: Diagnosis not present

## 2022-10-21 DIAGNOSIS — M109 Gout, unspecified: Secondary | ICD-10-CM | POA: Diagnosis not present

## 2022-10-21 DIAGNOSIS — G5603 Carpal tunnel syndrome, bilateral upper limbs: Secondary | ICD-10-CM | POA: Diagnosis not present

## 2022-10-21 DIAGNOSIS — F419 Anxiety disorder, unspecified: Secondary | ICD-10-CM | POA: Diagnosis not present

## 2023-02-20 DIAGNOSIS — W57XXXA Bitten or stung by nonvenomous insect and other nonvenomous arthropods, initial encounter: Secondary | ICD-10-CM | POA: Diagnosis not present

## 2023-02-20 DIAGNOSIS — L989 Disorder of the skin and subcutaneous tissue, unspecified: Secondary | ICD-10-CM | POA: Diagnosis not present

## 2023-02-21 DIAGNOSIS — L03119 Cellulitis of unspecified part of limb: Secondary | ICD-10-CM | POA: Diagnosis not present

## 2023-02-23 DIAGNOSIS — Z6823 Body mass index (BMI) 23.0-23.9, adult: Secondary | ICD-10-CM | POA: Diagnosis not present

## 2023-02-23 DIAGNOSIS — L03115 Cellulitis of right lower limb: Secondary | ICD-10-CM | POA: Diagnosis not present

## 2023-05-09 DIAGNOSIS — M7711 Lateral epicondylitis, right elbow: Secondary | ICD-10-CM | POA: Diagnosis not present

## 2023-06-16 DIAGNOSIS — D2261 Melanocytic nevi of right upper limb, including shoulder: Secondary | ICD-10-CM | POA: Diagnosis not present

## 2023-06-16 DIAGNOSIS — D235 Other benign neoplasm of skin of trunk: Secondary | ICD-10-CM | POA: Diagnosis not present

## 2023-06-16 DIAGNOSIS — D485 Neoplasm of uncertain behavior of skin: Secondary | ICD-10-CM | POA: Diagnosis not present

## 2023-06-16 DIAGNOSIS — L298 Other pruritus: Secondary | ICD-10-CM | POA: Diagnosis not present

## 2023-06-16 DIAGNOSIS — R208 Other disturbances of skin sensation: Secondary | ICD-10-CM | POA: Diagnosis not present

## 2023-06-16 DIAGNOSIS — L820 Inflamed seborrheic keratosis: Secondary | ICD-10-CM | POA: Diagnosis not present

## 2023-06-16 DIAGNOSIS — D2262 Melanocytic nevi of left upper limb, including shoulder: Secondary | ICD-10-CM | POA: Diagnosis not present

## 2023-06-16 DIAGNOSIS — D2272 Melanocytic nevi of left lower limb, including hip: Secondary | ICD-10-CM | POA: Diagnosis not present

## 2023-06-16 DIAGNOSIS — D225 Melanocytic nevi of trunk: Secondary | ICD-10-CM | POA: Diagnosis not present

## 2023-06-16 DIAGNOSIS — L538 Other specified erythematous conditions: Secondary | ICD-10-CM | POA: Diagnosis not present

## 2024-03-08 ENCOUNTER — Other Ambulatory Visit: Payer: Self-pay | Admitting: Orthopedic Surgery

## 2024-03-08 DIAGNOSIS — M25551 Pain in right hip: Secondary | ICD-10-CM

## 2024-03-08 DIAGNOSIS — M25351 Other instability, right hip: Secondary | ICD-10-CM

## 2024-03-08 DIAGNOSIS — G8929 Other chronic pain: Secondary | ICD-10-CM

## 2024-03-27 ENCOUNTER — Inpatient Hospital Stay
Admission: RE | Admit: 2024-03-27 | Discharge: 2024-03-27 | Disposition: A | Discharge status: Home / Self Care | Source: Ambulatory Visit | Attending: Orthopedic Surgery | Admitting: Orthopedic Surgery

## 2024-03-27 ENCOUNTER — Ambulatory Visit
Admission: RE | Admit: 2024-03-27 | Discharge: 2024-03-27 | Disposition: A | Discharge status: Home / Self Care | Source: Ambulatory Visit | Attending: Orthopedic Surgery | Admitting: Orthopedic Surgery

## 2024-03-27 DIAGNOSIS — M25551 Pain in right hip: Secondary | ICD-10-CM

## 2024-03-27 DIAGNOSIS — M25351 Other instability, right hip: Secondary | ICD-10-CM

## 2024-03-27 DIAGNOSIS — G8929 Other chronic pain: Secondary | ICD-10-CM

## 2024-03-27 MED ORDER — IOPAMIDOL (ISOVUE-M 200) INJECTION 41%
10.0000 mL | Freq: Once | INTRAMUSCULAR | Status: AC
  Administered 2024-03-27: 10 mL via INTRA_ARTICULAR

## 2024-04-23 ENCOUNTER — Ambulatory Visit (INDEPENDENT_AMBULATORY_CARE_PROVIDER_SITE_OTHER)

## 2024-04-23 ENCOUNTER — Encounter: Payer: Self-pay | Admitting: Emergency Medicine

## 2024-04-23 ENCOUNTER — Ambulatory Visit
Admission: EM | Admit: 2024-04-23 | Discharge: 2024-04-23 | Disposition: A | Discharge status: Home / Self Care | Attending: Family Medicine | Admitting: Family Medicine

## 2024-04-23 DIAGNOSIS — F17210 Nicotine dependence, cigarettes, uncomplicated: Secondary | ICD-10-CM | POA: Diagnosis present

## 2024-04-23 DIAGNOSIS — J209 Acute bronchitis, unspecified: Secondary | ICD-10-CM | POA: Diagnosis not present

## 2024-04-23 DIAGNOSIS — R051 Acute cough: Secondary | ICD-10-CM

## 2024-04-23 LAB — SARS CORONAVIRUS 2 BY RT PCR: SARS Coronavirus 2 by RT PCR: NEGATIVE

## 2024-04-23 MED ORDER — AZITHROMYCIN 250 MG PO TABS: ORAL_TABLET | ORAL | 0 refills | Status: AC

## 2024-04-23 MED ORDER — PREDNISONE 20 MG PO TABS: 40.0000 mg | ORAL_TABLET | Freq: Every day | ORAL | 0 refills | Status: AC

## 2024-04-23 NOTE — Discharge Instructions (Signed)
 Your COVID test is negative. Your chest xray did not show evidence of pneumonia.  We will treat you for bronchitis. Stop by the pharmacy to pick up your prescriptions.  Follow up with your primary care provider or return to the urgent care, if not improving.

## 2024-04-23 NOTE — ED Triage Notes (Signed)
 Pt presents with sinus pressure and congestion x 2 days. Pt states she is going out of town and would like to be treated before symptoms are worse. She has tried OTC Mucinex .

## 2024-04-23 NOTE — ED Provider Notes (Signed)
 [facility]    CSN: [id number] Arrival date & time: 04/23/24  1559      History   Chief Complaint Chief Complaint  Patient presents with   sinus pressure    Nasal Congestion    HPI Emily Knox is a 53 y.o. female.   HPI  History obtained from the patient. Emily Knox presents for cough, chest congestion, nasal congestion and left ear pain that started a couple days ago.  Tried Mucinex  with some relief.  No fever, vomiting, diarrhea or sore throat. No known sick contacts. She is going out of town and wants to get treated before she lives. Chest congestion bothers her the most.    She is a smoker.       Past Medical History:  Diagnosis Date   Allergy    Anxiety    panic attacks   Cough    due to allergies   Dysrhythmia    palpatations - due to thyroid    GERD (gastroesophageal reflux disease)    Hyperlipidemia    Hypothyroidism    Low back strain    Lyme disease    2010   Motion sickness    all moving vehicles   Ovarian cancer ([facility]) 1991   Left Oophorectomy   Seasonal allergies     Patient Active Problem List   Diagnosis Date Noted   Subacute maxillary sinusitis 08/07/2020   URI with cough and congestion 08/07/2020   Tobacco use disorder 08/07/2020   Smoking trying to quit 01/17/2019   Exposure to the flu 10/13/2018   Bloating 10/13/2018   Left elbow pain 10/13/2018   Rectal bleeding 10/13/2018   Routine physical examination 04/14/2017   Hypothyroidism 04/14/2017   GAD (generalized anxiety disorder) 04/14/2017   History of ovarian cancer 04/14/2017    Past Surgical History:  Procedure Laterality Date   COLONOSCOPY WITH PROPOFOL  N/A 02/27/2019   Procedure: COLONOSCOPY WITH PROPOFOL ;  Surgeon: Unk Corinn Skiff, MD;  Location: [facility] ENDOSCOPY;  Service: Gastroenterology;  Laterality: N/A;   LEFT OOPHORECTOMY     MASS BIOPSY N/A 01/31/2015   Procedure: NECK MASS BIOPSY;  Surgeon: Chinita Hasten, MD;  [facility];   Service: ENT;  Laterality: N/A;    OB History   No obstetric history on file.      Home Medications    Prior to Admission medications   Medication Sig Start Date End Date Taking? Authorizing Provider  azithromycin  (ZITHROMAX  Z-PAK) 250 MG tablet Take 2 tablets on day 1 then 1 tablet daily 04/23/24  Yes Yalanda Soderman, DO  predniSONE  (DELTASONE ) 20 MG tablet Take 2 tablets (40 mg total) by mouth daily for 5 days. 04/23/24 04/28/24 Yes Kaari Zeigler, DO  SYNTHROID 100 MCG tablet  04/04/17   [provider]  triamcinolone  (NASACORT ) 55 MCG/ACT AERO nasal inhaler Place 2 sprays into the nose daily. 08/07/20   Moishe Suzen LABOR, NP    Family History Family History  Problem Relation Age of Onset   COPD Father    Colon cancer Paternal Grandmother 91   Ovarian cancer Neg Hx     Social History Social History   Tobacco Use   Smoking status: Every Day    Current packs/day: 1.00    Average packs/day: 1 pack/day for 20.0 years (20.0 ttl pk-yrs)    Types: Cigarettes   Smokeless tobacco: Never  Vaping Use   Vaping status: Former  Substance Use Topics   Alcohol use: Yes    Alcohol/week: 4.0 standard  drinks of alcohol    Types: 4 Cans of beer per week   Drug use: No     Allergies   Onion, Peanuts [peanut oil], and Tomato   Review of Systems Review of Systems: negative unless otherwise stated in HPI.      Physical Exam Triage Vital Signs ED Triage Vitals  Encounter Vitals Group     BP 04/23/24 1630 107/72     Girls Systolic BP Percentile --      Girls Diastolic BP Percentile --      Boys Systolic BP Percentile --      Boys Diastolic BP Percentile --      Pulse Rate 04/23/24 1630 86     Resp 04/23/24 1630 16     Temp 04/23/24 1630 98.7 F (37.1 C)     Temp Source 04/23/24 1630 Oral     SpO2 04/23/24 1630 96 %     Weight --      Height --      Head Circumference --      Peak Flow --      Pain Score 04/23/24 1629 0     Pain Loc --      Pain Education --       Exclude from Growth Chart --    No data found.  Updated Vital Signs BP 107/72 (BP Location: Right Arm)   Pulse 86   Temp 98.7 F (37.1 C) (Oral)   Resp 16   SpO2 96%   Visual Acuity Right Eye Distance:   Left Eye Distance:   Bilateral Distance:    Right Eye Near:   Left Eye Near:    Bilateral Near:     Physical Exam GEN:     alert, non-toxic appearing female in no distress    HENT:  mucus membranes moist, oropharyngeal without lesions or erythema, no tonsillar hypertrophy or exudates, clear nasal discharge EYES:   no scleral injection or discharge NECK:  normal ROM RESP:  no increased work of breathing, rhonchi bilaterally CVS:   regular rate and rhythm Skin:   warm and dry    UC Treatments / Results  Labs (all labs ordered are listed, but only abnormal results are displayed) Labs Reviewed  SARS CORONAVIRUS 2 BY RT PCR    EKG   Radiology DG Chest 2 View Result Date: 04/23/2024 CLINICAL DATA:  Cough, sinus pressure and chest congestion. EXAM: CHEST - 2 VIEW COMPARISON:  None Available. FINDINGS: Trachea is midline. Heart size normal. Lungs are clear. No pleural fluid. Mild pectus deformity. Degenerative changes in the spine. IMPRESSION: No acute findings. Electronically Signed   By: Newell Eke M.D.   On: 04/23/2024 17:44    Procedures Procedures (including critical care time)  Medications Ordered in UC Medications - No data to display  Initial Impression / Assessment and Plan / UC Course  I have reviewed the triage vital signs and the nursing notes.  Pertinent labs & imaging results that were available during my care of the patient were reviewed by me and considered in my medical decision making (see chart for details).       Pt is a 53 y.o. female who smokes presents for worsening respiratory symptoms. Fatim is afebrile here without recent antipyretics. Satting well on room air. Overall pt is non-toxic appearing, well hydrated, without  respiratory distress. Pulmonary exam is remarkable for rhonchi.  Chest xray personally reviewed by me without focal pneumonia, pleural effusion, cardiomegaly or pneumothorax. Radiologist impression reviewed.  COVID obtained and was negative. Suspect viral respiratory illness however could be early onset bronchitis given her history of smoking.  Discussed symptomatic treatment. Treat with steroids and antibiotics as below. Typical duration of symptoms discussed.   Return and ED precautions given and voiced understanding. Discussed MDM, treatment plan and plan for follow-up with patient who agrees with plan.     Final Clinical Impressions(s) / UC Diagnoses   Final diagnoses:  Cigarette smoker  Acute bronchitis, unspecified organism     Discharge Instructions      Your COVID test is negative. Your chest xray did not show evidence of pneumonia.  We will treat you for bronchitis. Stop by the pharmacy to pick up your prescriptions.  Follow up with your primary care provider or return to the urgent care, if not improving.       ED Prescriptions     Medication Sig Dispense Auth. Provider   azithromycin  (ZITHROMAX  Z-PAK) 250 MG tablet Take 2 tablets on day 1 then 1 tablet daily 6 tablet Sergi Gellner, DO   predniSONE  (DELTASONE ) 20 MG tablet Take 2 tablets (40 mg total) by mouth daily for 5 days. 10 tablet Asar Evilsizer, DO      PDMP not reviewed this encounter.   Caylah Plouff, DO 04/23/24 8182
